# Patient Record
Sex: Female | Born: 1965 | Race: White | Hispanic: No | Smoking: Former smoker
Health system: Southern US, Community
[De-identification: ages and names within clinical notes are randomized; demographics above are authoritative.]

## PROBLEM LIST (undated history)

## (undated) DIAGNOSIS — Z9889 Other specified postprocedural states: Secondary | ICD-10-CM

## (undated) DIAGNOSIS — R112 Nausea with vomiting, unspecified: Secondary | ICD-10-CM

## (undated) DIAGNOSIS — I10 Essential (primary) hypertension: Secondary | ICD-10-CM

## (undated) DIAGNOSIS — K219 Gastro-esophageal reflux disease without esophagitis: Secondary | ICD-10-CM

## (undated) HISTORY — DX: Gastro-esophageal reflux disease without esophagitis: K21.9

## (undated) HISTORY — DX: Essential (primary) hypertension: I10

## (undated) HISTORY — PX: WISDOM TOOTH EXTRACTION: SHX21

---

## 1986-01-29 HISTORY — PX: BREAST SURGERY: SHX581

## 1986-01-29 HISTORY — PX: REDUCTION MAMMAPLASTY: SUR839

## 1999-04-17 ENCOUNTER — Other Ambulatory Visit: Admission: RE | Admit: 1999-04-17 | Discharge: 1999-04-17 | Payer: Self-pay | Admitting: Gynecology

## 2000-08-02 ENCOUNTER — Other Ambulatory Visit: Admission: RE | Admit: 2000-08-02 | Discharge: 2000-08-02 | Payer: Self-pay | Admitting: Gynecology

## 2002-01-16 ENCOUNTER — Other Ambulatory Visit: Admission: RE | Admit: 2002-01-16 | Discharge: 2002-01-16 | Payer: Self-pay | Admitting: Gynecology

## 2003-05-10 ENCOUNTER — Other Ambulatory Visit: Admission: RE | Admit: 2003-05-10 | Discharge: 2003-05-10 | Payer: Self-pay | Admitting: Gynecology

## 2003-05-12 ENCOUNTER — Ambulatory Visit (HOSPITAL_COMMUNITY): Admission: RE | Admit: 2003-05-12 | Discharge: 2003-05-12 | Payer: Self-pay | Admitting: Gynecology

## 2005-01-29 HISTORY — PX: DILATION AND CURETTAGE OF UTERUS: SHX78

## 2012-06-24 ENCOUNTER — Encounter: Payer: Self-pay | Admitting: Gastroenterology

## 2012-06-25 ENCOUNTER — Encounter (HOSPITAL_COMMUNITY): Payer: Self-pay | Admitting: Pharmacy Technician

## 2012-06-25 ENCOUNTER — Encounter: Payer: Self-pay | Admitting: Gastroenterology

## 2012-06-25 ENCOUNTER — Ambulatory Visit (INDEPENDENT_AMBULATORY_CARE_PROVIDER_SITE_OTHER): Payer: Managed Care, Other (non HMO) | Admitting: Gastroenterology

## 2012-06-25 VITALS — BP 139/92 | HR 69 | Temp 97.6°F | Ht 64.0 in | Wt 185.8 lb

## 2012-06-25 DIAGNOSIS — R1013 Epigastric pain: Secondary | ICD-10-CM | POA: Insufficient documentation

## 2012-06-25 DIAGNOSIS — R1310 Dysphagia, unspecified: Secondary | ICD-10-CM | POA: Insufficient documentation

## 2012-06-25 DIAGNOSIS — K219 Gastro-esophageal reflux disease without esophagitis: Secondary | ICD-10-CM

## 2012-06-25 DIAGNOSIS — R1314 Dysphagia, pharyngoesophageal phase: Secondary | ICD-10-CM

## 2012-06-25 NOTE — Progress Notes (Signed)
Primary Care Physician:  [facility], Heather Huwe, PA-C  Primary Gastroenterologist:  Michael Rourk, MD   Chief Complaint  Patient presents with  . Gastrophageal Reflux    HPI:  Jessica Roman is a 47 y.o. female here for recent dysphagia and GERD. Several weeks ago, she developed acute issues swallowing. Food sticking in lower esophagus and associated with retrosternal pain and radiation into the back. Eating helps epigastric burning. Swallowing issues lasted for about one week. Happens with food and fish oil capsules. Felt egg mcmuffin stuck for two hours. In the past two years weight up 25 pounds, incraesed stress. Still with burning but dysphagia better. PPI two weeks. Some softer stools. No melena, brbpr. Ibuprofen for knee pains, 400mg three times a week. No melena, brbpr.   Current Outpatient Prescriptions  Medication Sig Dispense Refill  . hydrochlorothiazide (MICROZIDE) 12.5 MG capsule Take 12.5 mg by mouth daily.      . Multiple Vitamin (MULTIVITAMIN) capsule Take 1 capsule by mouth daily.      . omeprazole (PRILOSEC OTC) 20 MG tablet Take 20 mg by mouth daily.       No current facility-administered medications for this visit.    Allergies as of 06/25/2012  . (No Known Allergies)    Past Medical History  Diagnosis Date  . GERD (gastroesophageal reflux disease)   . Hypertension     Past Surgical History  Procedure Laterality Date  . Wisdom tooth extraction    . Dilation and curettage of uterus  2007  . Breast surgery  1988    Reduction    Family History  Problem Relation Age of Onset  . Colon cancer Neg Hx   . Ulcers Neg Hx   . Heart attack Father   . Inflammatory bowel disease Neg Hx   . Irritable bowel syndrome Sister     History   Social History  . Marital Status: Unknown    Spouse Name: N/A    Number of Children: N/A  . Years of Education: N/A   Occupational History  . Not on file.   Social History Main Topics  . Smoking status:  Never Smoker   . Smokeless tobacco: Not on file  . Alcohol Use: No     Comment: twice a week  . Drug Use: No  . Sexually Active: Not on file   Other Topics Concern  . Not on file   Social History Narrative  . No narrative on file      ROS:  General: Negative for anorexia, weight loss, fever, chills, fatigue, weakness. Eyes: Negative for vision changes.  ENT: Negative for hoarseness, nasal congestion. CV: Negative for chest pain, angina, palpitations, dyspnea on exertion, peripheral edema.  Respiratory: Negative for dyspnea at rest, dyspnea on exertion, cough, sputum, wheezing.  GI: See history of present illness. GU:  Negative for dysuria, hematuria, urinary incontinence, urinary frequency, nocturnal urination.  MS: Negative for joint pain, low back pain.  Derm: Negative for rash or itching.  Neuro: Negative for weakness, abnormal sensation, seizure, frequent headaches, memory loss, confusion.  Psych: Negative for anxiety, depression, suicidal ideation, hallucinations.  Endo: Negative for unusual weight change.  Heme: Negative for bruising or bleeding. Allergy: Negative for rash or hives.    Physical Examination:  BP 139/92  Pulse 69  Temp(Src) 97.6 F (36.4 C) (Oral)  Ht 5' 4" (1.626 m)  Wt 185 lb 12.8 oz (84.278 kg)  BMI 31.88 kg/m2  LMP 05/30/2012   General: Well-nourished, well-developed   in no acute distress.  Head: Normocephalic, atraumatic.   Eyes: Conjunctiva pink, no icterus. Mouth: Oropharyngeal mucosa moist and pink , no lesions erythema or exudate. Neck: Supple without thyromegaly, masses, or lymphadenopathy.  Lungs: Clear to auscultation bilaterally.  Heart: Regular rate and rhythm, no murmurs rubs or gallops.  Abdomen: Bowel sounds are normal, nontender, nondistended, no hepatosplenomegaly or masses, no abdominal bruits or    hernia , no rebound or guarding.   Rectal: not performed Extremities: No lower extremity edema. No clubbing or deformities.   Neuro: Alert and oriented x 4 , grossly normal neurologically.  Skin: Warm and dry, no rash or jaundice.   Psych: Alert and cooperative, normal mood and affect.   Labs from 03/2012: HgbA1C: 5.8, WBC 7100, H/H 14.6/42.1, Plt 319000 

## 2012-06-25 NOTE — Assessment & Plan Note (Addendum)
47 y/o female with new onset GERD, epigastric burning, esophageal dysphagia. Some improvement on PPI. Recent regular NSAID use. DDx includes reflux esophagitis, pill induced esophagitis, PUD. Recommend EGD+/-ED in the near future.  I have discussed the risks, alternatives, benefits with regards to but not limited to the risk of reaction to medication, bleeding, infection, perforation and the patient is agreeable to proceed. Written consent to be obtained.  Symptoms likely worsened by progressive weight gain. Recommend slow gradual weight loss of at least 10 pounds.  Continue PPI.

## 2012-06-25 NOTE — Patient Instructions (Addendum)
1. We have scheduled you for an upper endoscopy. Please see separate instructions.

## 2012-06-26 NOTE — Progress Notes (Signed)
Cc PCP 

## 2012-06-30 ENCOUNTER — Encounter (HOSPITAL_COMMUNITY): Payer: Self-pay | Admitting: *Deleted

## 2012-06-30 ENCOUNTER — Encounter (HOSPITAL_COMMUNITY)
Admission: RE | Disposition: A | Discharge status: Home / Self Care | Payer: Self-pay | Source: Ambulatory Visit | Attending: Internal Medicine

## 2012-06-30 ENCOUNTER — Ambulatory Visit (HOSPITAL_COMMUNITY)
Admission: RE | Admit: 2012-06-30 | Discharge: 2012-06-30 | Disposition: A | Discharge status: Home / Self Care | Payer: Managed Care, Other (non HMO) | Source: Ambulatory Visit | Attending: Internal Medicine | Admitting: Internal Medicine

## 2012-06-30 DIAGNOSIS — R1013 Epigastric pain: Secondary | ICD-10-CM

## 2012-06-30 DIAGNOSIS — K219 Gastro-esophageal reflux disease without esophagitis: Secondary | ICD-10-CM

## 2012-06-30 DIAGNOSIS — R079 Chest pain, unspecified: Secondary | ICD-10-CM

## 2012-06-30 DIAGNOSIS — K449 Diaphragmatic hernia without obstruction or gangrene: Secondary | ICD-10-CM | POA: Insufficient documentation

## 2012-06-30 DIAGNOSIS — R1310 Dysphagia, unspecified: Secondary | ICD-10-CM | POA: Insufficient documentation

## 2012-06-30 DIAGNOSIS — I10 Essential (primary) hypertension: Secondary | ICD-10-CM | POA: Insufficient documentation

## 2012-06-30 DIAGNOSIS — Z79899 Other long term (current) drug therapy: Secondary | ICD-10-CM | POA: Insufficient documentation

## 2012-06-30 HISTORY — PX: ESOPHAGOGASTRODUODENOSCOPY (EGD) WITH ESOPHAGEAL DILATION: SHX5812

## 2012-06-30 SURGERY — ESOPHAGOGASTRODUODENOSCOPY (EGD) WITH ESOPHAGEAL DILATION
Anesthesia: Moderate Sedation

## 2012-06-30 MED ORDER — MIDAZOLAM HCL 5 MG/5ML IJ SOLN
INTRAMUSCULAR | Status: AC
  Filled 2012-06-30: qty 10

## 2012-06-30 MED ORDER — MEPERIDINE HCL 100 MG/ML IJ SOLN
INTRAMUSCULAR | Status: DC | PRN
  Administered 2012-06-30 (×2): 25 mg via INTRAVENOUS
  Administered 2012-06-30: 50 mg via INTRAVENOUS

## 2012-06-30 MED ORDER — ONDANSETRON HCL 4 MG/2ML IJ SOLN
INTRAMUSCULAR | Status: DC | PRN
  Administered 2012-06-30: 4 mg via INTRAVENOUS

## 2012-06-30 MED ORDER — SODIUM CHLORIDE 0.9 % IV SOLN
INTRAVENOUS | Status: DC
  Administered 2012-06-30: 07:00:00 via INTRAVENOUS

## 2012-06-30 MED ORDER — MIDAZOLAM HCL 5 MG/5ML IJ SOLN
INTRAMUSCULAR | Status: DC | PRN
  Administered 2012-06-30: 1 mg via INTRAVENOUS
  Administered 2012-06-30 (×2): 2 mg via INTRAVENOUS
  Administered 2012-06-30: 1 mg via INTRAVENOUS

## 2012-06-30 MED ORDER — STERILE WATER FOR IRRIGATION IR SOLN
Status: DC | PRN
  Administered 2012-06-30: 08:00:00

## 2012-06-30 MED ORDER — MEPERIDINE HCL 100 MG/ML IJ SOLN
INTRAMUSCULAR | Status: AC
  Filled 2012-06-30: qty 2

## 2012-06-30 MED ORDER — BUTAMBEN-TETRACAINE-BENZOCAINE 2-2-14 % EX AERO
INHALATION_SPRAY | CUTANEOUS | Status: DC | PRN
  Administered 2012-06-30: 2 via TOPICAL

## 2012-06-30 MED ORDER — ONDANSETRON HCL 4 MG/2ML IJ SOLN
INTRAMUSCULAR | Status: AC
  Filled 2012-06-30: qty 2

## 2012-06-30 NOTE — H&P (View-Only) (Signed)
Primary Care Physician:  [facility], Bartolo Darter, [place]  Primary Gastroenterologist:  Roetta Sessions, MD   Chief Complaint  Patient presents with  . Gastrophageal Reflux    HPI:  Jessica Roman is a 47 y.o. female here for recent dysphagia and GERD. Several weeks ago, she developed acute issues swallowing. Food sticking in lower esophagus and associated with retrosternal pain and radiation into the back. Eating helps epigastric burning. Swallowing issues lasted for about one week. Happens with food and fish oil capsules. Felt egg mcmuffin stuck for two hours. In the past two years weight up 25 pounds, incraesed stress. Still with burning but dysphagia better. PPI two weeks. Some softer stools. No melena, brbpr. Ibuprofen for knee pains, 400mg  three times a week. No melena, brbpr.   Current Outpatient Prescriptions  Medication Sig Dispense Refill  . hydrochlorothiazide (MICROZIDE) 12.5 MG capsule Take 12.5 mg by mouth daily.      . Multiple Vitamin (MULTIVITAMIN) capsule Take 1 capsule by mouth daily.      Marland Kitchen omeprazole (PRILOSEC OTC) 20 MG tablet Take 20 mg by mouth daily.       No current facility-administered medications for this visit.    Allergies as of 06/25/2012  . (No Known Allergies)    Past Medical History  Diagnosis Date  . GERD (gastroesophageal reflux disease)   . Hypertension     Past Surgical History  Procedure Laterality Date  . Wisdom tooth extraction    . Dilation and curettage of uterus  2007  . Breast surgery  1988    Reduction    Family History  Problem Relation Age of Onset  . Colon cancer Neg Hx   . Ulcers Neg Hx   . Heart attack Father   . Inflammatory bowel disease Neg Hx   . Irritable bowel syndrome Sister     History   Social History  . Marital Status: Unknown    Spouse Name: N/A    Number of Children: N/A  . Years of Education: N/A   Occupational History  . Not on file.   Social History Main Topics  . Smoking status:  Never Smoker   . Smokeless tobacco: Not on file  . Alcohol Use: No     Comment: twice a week  . Drug Use: No  . Sexually Active: Not on file   Other Topics Concern  . Not on file   Social History Narrative  . No narrative on file      ROS:  General: Negative for anorexia, weight loss, fever, chills, fatigue, weakness. Eyes: Negative for vision changes.  ENT: Negative for hoarseness, nasal congestion. CV: Negative for chest pain, angina, palpitations, dyspnea on exertion, peripheral edema.  Respiratory: Negative for dyspnea at rest, dyspnea on exertion, cough, sputum, wheezing.  GI: See history of present illness. GU:  Negative for dysuria, hematuria, urinary incontinence, urinary frequency, nocturnal urination.  MS: Negative for joint pain, low back pain.  Derm: Negative for rash or itching.  Neuro: Negative for weakness, abnormal sensation, seizure, frequent headaches, memory loss, confusion.  Psych: Negative for anxiety, depression, suicidal ideation, hallucinations.  Endo: Negative for unusual weight change.  Heme: Negative for bruising or bleeding. Allergy: Negative for rash or hives.    Physical Examination:  BP 139/92  Pulse 69  Temp(Src) 97.6 F (36.4 C) (Oral)  Ht 5\' 4"  (1.626 m)  Wt 185 lb 12.8 oz (84.278 kg)  BMI 31.88 kg/m2  LMP 05/30/2012   General: Well-nourished, well-developed  in no acute distress.  Head: Normocephalic, atraumatic.   Eyes: Conjunctiva pink, no icterus. Mouth: Oropharyngeal mucosa moist and pink , no lesions erythema or exudate. Neck: Supple without thyromegaly, masses, or lymphadenopathy.  Lungs: Clear to auscultation bilaterally.  Heart: Regular rate and rhythm, no murmurs rubs or gallops.  Abdomen: Bowel sounds are normal, nontender, nondistended, no hepatosplenomegaly or masses, no abdominal bruits or    hernia , no rebound or guarding.   Rectal: not performed Extremities: No lower extremity edema. No clubbing or deformities.   Neuro: Alert and oriented x 4 , grossly normal neurologically.  Skin: Warm and dry, no rash or jaundice.   Psych: Alert and cooperative, normal mood and affect.   Labs from 03/2012: HgbA1C: 5.8, WBC 7100, H/H 14.6/42.1, Plt 319000

## 2012-06-30 NOTE — Op Note (Signed)
[facility] [address] [place], [address]   ENDOSCOPY PROCEDURE REPORT  PATIENT: Jessica Roman, Jessica Roman  MR#: [id number] BIRTHDATE: 24-May-1965 , 47  yrs. old GENDER: Female ENDOSCOPIST: R.  Roetta Sessions, MD FACP [facility] Graham REFERRED BY:   [facility] PROCEDURE DATE:  06/30/2012 PROCEDURE:     EGD with Elease Hashimoto dilation and esophageal biopsy  INDICATIONS:     GERD/chest pain and esophageal dysphagia  INFORMED CONSENT:   The risks, benefits, limitations, alternatives and imponderables have been discussed.  The potential for biopsy, esophogeal dilation, etc. have also been reviewed.  Questions have been answered.  All parties agreeable.  Please see the history and physical in the medical record for more information.  MEDICATIONS:  Versed 6 mg IV and Demerol 100 mg IV in divided doses. Cetacaine spray Zofran 4 mg IV  DESCRIPTION OF PROCEDURE:   The XB-1478G ([id number])  endoscope was introduced through the mouth and advanced to the second portion of the duodenum without difficulty or limitations.  The mucosal surfaces were surveyed very carefully during advancement of the scope and upon withdrawal.  Retroflexion view of the proximal stomach and esophagogastric junction was performed.      FINDINGS:   Normal-appearing, patent appearing tubular esophagus. Normal appearing esophageal mucosa. Stomach empty. Small hiatal hernia. Normal gastric mucosa. Patent pylorus. Normal first and second portion of the duodenum  THERAPEUTIC / DIAGNOSTIC MANEUVERS PERFORMED: A 54 French Maloney dilator was passed to full insertion easily. A look back revealed no apparent complication related to the liver. Subsequently, biopsies of the distal esophageal taken to evaluate for eosinophilic esophagitis.   COMPLICATIONS:  None  IMPRESSION:  Small hiatal hernia; otherwise normal examination. Status post Maloney dilation and esophageal biopsy  RECOMMENDATIONS:  Followup on  pathology    _______________________________ R. Roetta Sessions, MD FACP [facility] eSigned:  R. Roetta Sessions, MD FACP [facility] 06/30/2012 8:23 AM     CC:

## 2012-06-30 NOTE — Interval H&P Note (Signed)
History and Physical Interval Note:  06/30/2012 7:42 AM  Jessica Roman  has presented today for surgery, with the diagnosis of Epgigastric Pain, GERD, Dysphagia  The various methods of treatment have been discussed with the patient and family. After consideration of risks, benefits and other options for treatment, the patient has consented to  Procedure(s) with comments: ESOPHAGOGASTRODUODENOSCOPY (EGD) WITH ESOPHAGEAL DILATION (N/A) - 1:30-moved to 730 Pt notified by Florian Buff as a surgical intervention .  The patient's history has been reviewed, patient examined, no change in status, stable for surgery.  I have reviewed the patient's chart and labs.  Questions were answered to the patient's satisfaction.     [name]  EGD with possible esophageal dilation/biopsy, etc. as appropriate.The risks, benefits, limitations, alternatives and imponderables have been reviewed with the patient. Questions have been answered. All parties are agreeable.

## 2012-07-01 ENCOUNTER — Encounter (HOSPITAL_COMMUNITY): Payer: Self-pay | Admitting: Internal Medicine

## 2012-07-13 ENCOUNTER — Encounter: Payer: Self-pay | Admitting: Internal Medicine

## 2012-07-15 NOTE — Progress Notes (Signed)
Cc PCP 

## 2012-07-15 NOTE — Progress Notes (Unsigned)
Letter from: Corbin Ade   Send letter to patient.  Send copy of letter with path to referring provider and PCP.  Would offer f/u appt w extender 4- 6 weeks re: chest pain/dysphagia

## 2012-07-16 ENCOUNTER — Encounter: Payer: Self-pay | Admitting: Internal Medicine

## 2012-07-16 NOTE — Progress Notes (Signed)
Pt is aware of OV on 7/16 at 0945 and appt card was mailed

## 2012-08-11 ENCOUNTER — Encounter: Payer: Self-pay | Admitting: Internal Medicine

## 2012-08-13 ENCOUNTER — Ambulatory Visit: Payer: Managed Care, Other (non HMO) | Admitting: Gastroenterology

## 2012-08-13 ENCOUNTER — Telehealth: Payer: Self-pay | Admitting: Gastroenterology

## 2012-08-13 NOTE — Telephone Encounter (Signed)
Patient was a no-show 

## 2012-12-04 ENCOUNTER — Other Ambulatory Visit: Payer: Self-pay

## 2013-11-13 ENCOUNTER — Other Ambulatory Visit: Payer: Self-pay

## 2014-12-16 ENCOUNTER — Ambulatory Visit (INDEPENDENT_AMBULATORY_CARE_PROVIDER_SITE_OTHER): Payer: PRIVATE HEALTH INSURANCE | Admitting: Nurse Practitioner

## 2014-12-16 ENCOUNTER — Encounter: Payer: Self-pay | Admitting: Nurse Practitioner

## 2014-12-16 VITALS — BP 150/89 | HR 73 | Temp 98.3°F | Ht 64.0 in | Wt 194.0 lb

## 2014-12-16 DIAGNOSIS — R1314 Dysphagia, pharyngoesophageal phase: Secondary | ICD-10-CM

## 2014-12-16 DIAGNOSIS — R1319 Other dysphagia: Secondary | ICD-10-CM

## 2014-12-16 DIAGNOSIS — R1310 Dysphagia, unspecified: Secondary | ICD-10-CM

## 2014-12-16 DIAGNOSIS — K219 Gastro-esophageal reflux disease without esophagitis: Secondary | ICD-10-CM

## 2014-12-16 MED ORDER — RANITIDINE HCL 150 MG PO TABS: 150.0000 mg | ORAL_TABLET | Freq: Two times a day (BID) | ORAL | Status: DC

## 2014-12-16 NOTE — Progress Notes (Signed)
Referring Provider: No ref. provider found Primary Care Physician:  PROVIDER NOT IN SYSTEM Primary GI: Dr. Gala Romney  Chief Complaint  Patient presents with  . Abdominal Pain    HPI:   49 year old female presents for GERD symptoms. Last EGD 06/30/2012 which found small hiatal hernia, otherwise normal exam. Status post Maloney dilation and esophageal biopsy. Pathology showed biopsy to be unremarkable squamous new Cozaar, no features of eosinophilic esophagitis, fungi, dysplasia, or malignancy.  Today she states she did quite well after her EGD. About a year ago, she began having increased symptoms. Currently has burning epigastric pain about once every 1-2 months. Has been under increased stress lately. Take Protonix once daily which she knows helps because when she rarely misses a dose she can notice a difference. Denies dysphagia symptoms but does have a feeling like "something is stuck in my throat." Will have reflux bitter/sour taste occasionally. Occasional nausea. Denies other abdominal pain, vomiting, hematochezia, melena. Has had some darkened stools when she drank pepto bismol. Denies chest pain, dyspnea, dizziness, lightheadedness, syncope, near syncope. Denies any other upper or lower GI symptoms.  Past Medical History  Diagnosis Date  . GERD (gastroesophageal reflux disease)   . Hypertension     Past Surgical History  Procedure Laterality Date  . Wisdom tooth extraction    . Dilation and curettage of uterus  2007  . Breast surgery  1988    Reduction  . Esophagogastroduodenoscopy (egd) with esophageal dilation N/A 06/30/2012    [id number] hiatal hernia; otherwise normal examination/Maloney dilation     Current Outpatient Prescriptions  Medication Sig Dispense Refill  . hydrochlorothiazide (MICROZIDE) 12.5 MG capsule Take 12.5 mg by mouth daily.    Marland Kitchen losartan (COZAAR) 25 MG tablet Take 25 mg by mouth daily.    . Multiple Vitamin (MULTIVITAMIN) capsule Take 1 capsule by mouth  daily.    . pantoprazole (PROTONIX) 40 MG tablet Take 40 mg by mouth daily.    Marland Kitchen omeprazole (PRILOSEC OTC) 20 MG tablet Take 20 mg by mouth daily.     No current facility-administered medications for this visit.    Allergies as of 12/16/2014  . (No Known Allergies)    Family History  Problem Relation Age of Onset  . Colon cancer Neg Hx   . Ulcers Neg Hx   . Heart attack Father   . Inflammatory bowel disease Neg Hx   . Irritable bowel syndrome Sister     Social History   Social History  . Marital Status: Divorced    Spouse Name: N/A  . Number of Children: N/A  . Years of Education: N/A   Social History Main Topics  . Smoking status: Never Smoker   . Smokeless tobacco: None  . Alcohol Use: Yes     Comment: twice a week  . Drug Use: No  . Sexual Activity: Not Asked   Other Topics Concern  . None   Social History Narrative    Review of Systems: General: Negative for anorexia, weight loss, fever, chills, fatigue, weakness. Eyes: Negative for vision changes.  ENT: Negative for hoarseness, difficulty swallowing. CV: Negative for chest pain, angina, palpitations,  peripheral edema.  Respiratory: Negative for dyspnea at rest, cough, sputum, wheezing.  GI: See history of present illness. Endo: Negative for unusual weight change.  Heme: Negative for bruising or bleeding.   Physical Exam: BP 150/89 mmHg  Pulse 73  Temp(Src) 98.3 F (36.8 C) (Oral)  Ht 5\' 4"  (1.626 m)  Wt 194  lb (87.998 kg)  BMI 33.28 kg/m2 General:   Alert and oriented. Pleasant and cooperative. Well-nourished and well-developed.  Head:  Normocephalic and atraumatic. Eyes:  Without icterus, sclera clear and conjunctiva pink.  Ears:  Normal auditory acuity. Cardiovascular:  S1, S2 present without murmurs appreciated. Normal pulses noted. Extremities without clubbing or edema. Respiratory:  Clear to auscultation bilaterally. No wheezes, rales, or rhonchi. No distress.  Gastrointestinal:  +BS,  soft, and non-distended. Minimal epigastric TTP. No HSM noted. No guarding or rebound. No masses appreciated.  Rectal:  Deferred  Neurologic:  Alert and oriented x4;  grossly normal neurologically. Psych:  Alert and cooperative. Normal mood and affect.    12/16/2014 8:20 AM

## 2014-12-16 NOTE — Assessment & Plan Note (Signed)
Last endoscopy due to GERD and dysphagia symptoms. She states she has a funny feeling in her throat, however she is not having symptoms that she previously did were food is getting stuck. This does not sound like dysphagia symptoms. If she does indeed need an endoscopy added to her for sever screening colonoscopy (as noted above) this will also further evaluate. If no endoscopy needed in the near future can also consider barium pill esophagram to further evaluate his symptoms persist. Return for follow-up in 6-8 weeks.

## 2014-12-16 NOTE — Assessment & Plan Note (Signed)
As noticed an increase in symptoms over the past year. Her symptoms seem to be pretty irregular and intermittent. She states she's been under a lot of stress lately. Her epigastric pain only occurs about once every 1-2 months. Does have some reflux of bitter material. Has noted that she has put on some weight lately as well and thinks this may be causing some of her symptoms. At this point I will add Zantac 150 mg every other day to take in the evening. We will have her return in 6-8 weeks for follow-up. At this time we'll discuss her need for first-ever screening colonoscopy as she turns 16 in January. If her symptoms are still not improve we can consider adding an endoscopy onto her colonoscopy.

## 2014-12-16 NOTE — Patient Instructions (Signed)
1. I send in prescription for Zantac to your pharmacy. Take 150 mg pill every other day in evening. 2. Return for follow-up in 6-8 weeks. We will discuss your first ever screening colonoscopy at that point. 3. Call us if you have any worsening symptoms before then.

## 2014-12-20 NOTE — Progress Notes (Signed)
No pcp per pt °

## 2015-02-15 ENCOUNTER — Other Ambulatory Visit: Payer: Self-pay

## 2015-02-15 ENCOUNTER — Encounter: Payer: Self-pay | Admitting: Nurse Practitioner

## 2015-02-15 ENCOUNTER — Ambulatory Visit (INDEPENDENT_AMBULATORY_CARE_PROVIDER_SITE_OTHER): Payer: PRIVATE HEALTH INSURANCE | Admitting: Nurse Practitioner

## 2015-02-15 VITALS — BP 148/97 | HR 74 | Temp 97.5°F | Ht 63.25 in | Wt 196.4 lb

## 2015-02-15 DIAGNOSIS — K219 Gastro-esophageal reflux disease without esophagitis: Secondary | ICD-10-CM | POA: Diagnosis not present

## 2015-02-15 DIAGNOSIS — R1310 Dysphagia, unspecified: Secondary | ICD-10-CM

## 2015-02-15 DIAGNOSIS — Z1211 Encounter for screening for malignant neoplasm of colon: Secondary | ICD-10-CM

## 2015-02-15 DIAGNOSIS — R1314 Dysphagia, pharyngoesophageal phase: Secondary | ICD-10-CM

## 2015-02-15 DIAGNOSIS — R1319 Other dysphagia: Secondary | ICD-10-CM

## 2015-02-15 MED ORDER — PEG 3350-KCL-NA BICARB-NACL 420 G PO SOLR: 4000.0000 mL | ORAL | Status: DC

## 2015-02-15 NOTE — Progress Notes (Signed)
Referring Provider: No ref. provider found Primary Care Physician:  Wynelle Fanny Primary GI:  Dr. Gala Romney  Chief Complaint  Patient presents with  . Gastroesophageal Reflux    HPI:   50 year old female presents for follow-up on GERD symptoms and dysphagia. She was last seen in our office 12/16/2014 for the same. At that time she states she did very well after endoscopy and 45 [place] Maloney dilation completed on 06/30/2012 with esophageal biopsy unremarkable. At that time her GERD symptoms had well controlled with breakthrough about every 1-2 months, other more recently she was having an increase in symptoms associated with recent significant stress. At last visit we added Zantac 150 mg every other day in evening. Her dysphagia symptoms did not seem consistent with her previous symptoms and likely more nasal drainage. Decided to have her back in 6 weeks for follow-up and consider barium pill esophagram as well as to schedule her first-ever screening colonoscopy at age 34.  Today she states she is taking Zantac about every other evening. GERD symptoms improved, only 1 episode of dyspepsia since last visit (2 months ago). Stress is starting to improved as well. No further dysphagia symptoms. Denies hematochezia, melena. Occasional abdominal pain lower abdomen described as crampy and dull. Has a bowel movement daily, consistent with [place] 4, no straining. Denies fever, chills, N/V, unintentional weight loss. Denies chest pain, dyspnea, dizziness, lightheadedness, syncope, near syncope. Denies any other upper or lower GI symptoms.   Past Medical History  Diagnosis Date  . GERD (gastroesophageal reflux disease)   . Hypertension     Past Surgical History  Procedure Laterality Date  . Wisdom tooth extraction    . Dilation and curettage of uterus  2007  . Breast surgery  1988    Reduction  . Esophagogastroduodenoscopy (egd) with esophageal dilation N/A 06/30/2012    [id number] hiatal  hernia; otherwise normal examination/Maloney dilation     Current Outpatient Prescriptions  Medication Sig Dispense Refill  . hydrochlorothiazide (MICROZIDE) 12.5 MG capsule Take 12.5 mg by mouth daily.    Marland Kitchen losartan (COZAAR) 25 MG tablet Take 25 mg by mouth daily.    . Multiple Vitamin (MULTIVITAMIN) capsule Take 1 capsule by mouth daily.    . pantoprazole (PROTONIX) 40 MG tablet Take 40 mg by mouth daily.    . ranitidine (ZANTAC) 150 MG tablet Take 1 tablet (150 mg total) by mouth 2 (two) times daily. 30 tablet 2   No current facility-administered medications for this visit.    Allergies as of 02/15/2015  . (No Known Allergies)    Family History  Problem Relation Age of Onset  . Colon cancer Neg Hx   . Ulcers Neg Hx   . Heart attack Father   . Inflammatory bowel disease Neg Hx   . Irritable bowel syndrome Sister     Social History   Social History  . Marital Status: Divorced    Spouse Name: N/A  . Number of Children: N/A  . Years of Education: N/A   Social History Main Topics  . Smoking status: Never Smoker   . Smokeless tobacco: Never Used  . Alcohol Use: 0.0 oz/week    0 Standard drinks or equivalent per week     Comment: 4 times a week  . Drug Use: No  . Sexual Activity: Not Asked   Other Topics Concern  . None   Social History Narrative    Review of Systems: General: Negative for anorexia, weight loss, fever, chills, fatigue,  weakness. Eyes: Negative for vision changes.  ENT: Negative for hoarseness, difficulty swallowing. CV: Negative for chest pain, angina, palpitations, peripheral edema.  Respiratory: Negative for dyspnea at rest, cough, sputum, wheezing.  GI: See history of present illness. Endo: Negative for unusual weight change.  Heme: Negative for bruising or bleeding.   Physical Exam: BP 148/97 mmHg  Pulse 74  Temp(Src) 97.5 F (36.4 C) (Oral)  Ht 5' 3.25" (1.607 m)  Wt 196 lb 6.4 oz (89.086 kg)  BMI 34.50 kg/m2 General:   Alert and  oriented. Pleasant and cooperative. Well-nourished and well-developed.  Head:  Normocephalic and atraumatic. Eyes:  Without icterus, sclera clear and conjunctiva pink.  Ears:  Normal auditory acuity. Cardiovascular:  S1, S2 present without murmurs appreciated.  Extremities without clubbing or edema. Respiratory:  Clear to auscultation bilaterally. No wheezes, rales, or rhonchi. No distress.  Gastrointestinal:  +BS, soft, non-tender and non-distended. No HSM noted. No guarding or rebound. No masses appreciated.  Rectal:  Deferred  Neurologic:  Alert and oriented x4;  grossly normal neurologically. Psych:  Alert and cooperative. Normal mood and affect. Heme/Lymph/Immune: No excessive bruising noted.    02/15/2015 9:34 AM

## 2015-02-15 NOTE — Assessment & Plan Note (Signed)
GERD symptoms improved with the addition of Zantac 150 mg every other evening. Also with some improvement in the stress in her life as could be helping to alleviate her symptoms as well. Continue to monitor symptoms, return for follow-up in 6 months.

## 2015-02-15 NOTE — Assessment & Plan Note (Signed)
Dysphagia symptoms improved. Continue to monitor. Return for follow-up in 6 months.

## 2015-02-15 NOTE — Assessment & Plan Note (Signed)
Patient has turned 50 years old as of her last visit and is now due for initial screening colonoscopy. No history of colon cancer in her family, no other high risk indications. At this point she is generally asymptomatic from a GI standpoint with improvement in her GERD symptoms as noted above. At this point we'll proceed with first initial screening colonoscopy.  Proceed with TCS with Dr. Gala Romney in near future: the risks, benefits, and alternatives have been discussed with the patient in detail. The patient states understanding and desires to proceed.  Patient is not on any anticoagulants, anxiolytics, chronic pain medications, or antidepressants. Conscious sedation should be adequate for her procedure.

## 2015-02-15 NOTE — Patient Instructions (Signed)
1. Continue taking Protonix and Zantac as you have been taking. 2. We will schedule your procedure for you. 3. Further recommendations to be based on the results of your procedure. 4. Return for follow-up in 6 months to reassess all your symptoms and make any changes necessary.

## 2015-02-22 NOTE — Progress Notes (Signed)
CC'ED TO PCP 

## 2015-03-08 ENCOUNTER — Other Ambulatory Visit: Payer: Self-pay

## 2015-03-10 ENCOUNTER — Ambulatory Visit (HOSPITAL_COMMUNITY)
Admission: RE | Admit: 2015-03-10 | Discharge: 2015-03-10 | Disposition: A | Discharge status: Home / Self Care | Payer: PRIVATE HEALTH INSURANCE | Source: Ambulatory Visit | Attending: Internal Medicine | Admitting: Internal Medicine

## 2015-03-10 ENCOUNTER — Encounter (HOSPITAL_COMMUNITY)
Admission: RE | Disposition: A | Discharge status: Home / Self Care | Payer: Self-pay | Source: Ambulatory Visit | Attending: Internal Medicine

## 2015-03-10 ENCOUNTER — Encounter (HOSPITAL_COMMUNITY): Payer: Self-pay

## 2015-03-10 DIAGNOSIS — Z1211 Encounter for screening for malignant neoplasm of colon: Secondary | ICD-10-CM

## 2015-03-10 DIAGNOSIS — I10 Essential (primary) hypertension: Secondary | ICD-10-CM | POA: Insufficient documentation

## 2015-03-10 DIAGNOSIS — Z79899 Other long term (current) drug therapy: Secondary | ICD-10-CM | POA: Diagnosis not present

## 2015-03-10 DIAGNOSIS — K219 Gastro-esophageal reflux disease without esophagitis: Secondary | ICD-10-CM | POA: Diagnosis not present

## 2015-03-10 DIAGNOSIS — Z860100 Personal history of colon polyps, unspecified: Secondary | ICD-10-CM | POA: Insufficient documentation

## 2015-03-10 DIAGNOSIS — K5730 Diverticulosis of large intestine without perforation or abscess without bleeding: Secondary | ICD-10-CM | POA: Insufficient documentation

## 2015-03-10 DIAGNOSIS — D120 Benign neoplasm of cecum: Secondary | ICD-10-CM | POA: Diagnosis not present

## 2015-03-10 HISTORY — PX: COLONOSCOPY: SHX5424

## 2015-03-10 HISTORY — DX: Nausea with vomiting, unspecified: Z98.890

## 2015-03-10 HISTORY — DX: Nausea with vomiting, unspecified: R11.2

## 2015-03-10 SURGERY — COLONOSCOPY
Anesthesia: Moderate Sedation

## 2015-03-10 MED ORDER — SODIUM CHLORIDE 0.9 % IV SOLN
INTRAVENOUS | Status: DC
  Administered 2015-03-10: 11:00:00 via INTRAVENOUS

## 2015-03-10 MED ORDER — MEPERIDINE HCL 100 MG/ML IJ SOLN
INTRAMUSCULAR | Status: AC
  Filled 2015-03-10: qty 2

## 2015-03-10 MED ORDER — MIDAZOLAM HCL 5 MG/5ML IJ SOLN
INTRAMUSCULAR | Status: DC | PRN
  Administered 2015-03-10 (×2): 2 mg via INTRAVENOUS
  Administered 2015-03-10: 1 mg via INTRAVENOUS

## 2015-03-10 MED ORDER — ONDANSETRON HCL 4 MG/2ML IJ SOLN
INTRAMUSCULAR | Status: AC
  Filled 2015-03-10: qty 2

## 2015-03-10 MED ORDER — ONDANSETRON HCL 4 MG/2ML IJ SOLN
INTRAMUSCULAR | Status: DC | PRN
  Administered 2015-03-10: 4 mg via INTRAVENOUS

## 2015-03-10 MED ORDER — MIDAZOLAM HCL 5 MG/5ML IJ SOLN
INTRAMUSCULAR | Status: AC
  Filled 2015-03-10: qty 10

## 2015-03-10 MED ORDER — MEPERIDINE HCL 100 MG/ML IJ SOLN
INTRAMUSCULAR | Status: DC | PRN
  Administered 2015-03-10 (×2): 50 mg via INTRAVENOUS

## 2015-03-10 NOTE — Interval H&P Note (Signed)
History and Physical Interval Note:  03/10/2015 11:44 AM  Jessica Roman  has presented today for surgery, with the diagnosis of SCREENING  The various methods of treatment have been discussed with the patient and family. After consideration of risks, benefits and other options for treatment, the patient has consented to  Procedure(s) with comments: COLONOSCOPY (N/A) - 1000 - moved to 11:15 - office to notify as a surgical intervention .  The patient's history has been reviewed, patient examined, no change in status, stable for surgery.  I have reviewed the patient's chart and labs.  Questions were answered to the patient's satisfaction.     Ryland Tungate  No change. First ever screening colonoscopy per plan.  The risks, benefits, limitations, alternatives and imponderables have been reviewed with the patient. Questions have been answered. All parties are agreeable.

## 2015-03-10 NOTE — Op Note (Signed)
[facility] [address] [place], [address]   COLONOSCOPY PROCEDURE REPORT  PATIENT: Jessica Roman, Jessica Roman  MR#: [id number] BIRTHDATE: 06-Feb-1965 , 50  yrs. old GENDER: female ENDOSCOPIST: R.  Garfield Cornea, MD FACP [facility] REFERRED BY: Carlos Levering, FNP PROCEDURE DATE:  04/03/2015 PROCEDURE:   Colonoscopy with snare polypectomy INDICATIONS:   First ever average risk colorectal cancer screening examination. MEDICATIONS: Versed 5 mg IV and Demerol 100 mg IV in divided doses. Zofran 4 mg IV. ASA CLASS:       Class II  CONSENT: The risks, benefits, alternatives and imponderables including but not limited to bleeding, perforation as well as the possibility of a missed lesion have been reviewed.  The potential for biopsy, lesion removal, etc. have also been discussed. Questions have been answered.  All parties agreeable.  Please see the history and physical in the medical record for more information.  DESCRIPTION OF PROCEDURE:   After the risks benefits and alternatives of the procedure were thoroughly explained, informed consent was obtained.  The digital rectal exam revealed no abnormalities of the rectum.   The [id number]  endoscope was introduced through the anus and advanced to the cecum, which was identified by both the appendix and ileocecal valve. No adverse events experienced.   The quality of the prep was     The instrument was then slowly withdrawn as the colon was fully examined. Estimated blood loss is zero unless otherwise noted in this procedure report.      COLON FINDINGS: Internal hemorrhoids and anal papilla present; otherwise, normal-appearing rectal mucosa.  Scattered sigmoid diverticula; (1) 3 mm polyp in the base of the cecum at the appendiceal orifice; otherwise, remainder of colonic mucosa appeared normal.  The above-mentioned polyps was cold snare removed.  Retroflexion was performed. .  Withdrawal time=8 minutes 0 seconds.   The scope was withdrawn and the procedure completed. COMPLICATIONS: There were no immediate complications.  ENDOSCOPIC IMPRESSION: Single colonic polyp?"removed as described above. Colonic diverticulosis  RECOMMENDATIONS: Follow-up on pathology. Further recommendations to follow.  eSigned:  R. Garfield Cornea, MD Rosalita Chessman [facility] Apr 03, 2015 12:16 PM   cc:  CPT CODES: ICD CODES:  The ICD and CPT codes recommended by this software are interpretations from the data that the clinical staff has captured with the software.  The verification of the translation of this report to the ICD and CPT codes and modifiers is the sole responsibility of the health care institution and practicing physician where this report was generated.  [employer] will not be held responsible for the validity of the ICD and CPT codes included on this report.  AMA assumes no liability for data contained or not contained herein. CPT is a Designer, television/film set of the [employer].

## 2015-03-10 NOTE — Discharge Instructions (Signed)
°Colonoscopy °Discharge Instructions ° °Read the instructions outlined below and refer to this sheet in the next few weeks. These discharge instructions provide you with general information on caring for yourself after you leave the hospital. Your doctor may also give you specific instructions. While your treatment has been planned according to the most current medical practices available, unavoidable complications occasionally occur. If you have any problems or questions after discharge, call Dr. Rourk at [phone]. °ACTIVITY °· You may resume your regular activity, but move at a slower pace for the next 24 hours.  °· Take frequent rest periods for the next 24 hours.  °· Walking will help get rid of the air and reduce the bloated feeling in your belly (abdomen).  °· No driving for 24 hours (because of the medicine (anesthesia) used during the test).   °· Do not sign any important legal documents or operate any machinery for 24 hours (because of the anesthesia used during the test).  °NUTRITION °· Drink plenty of fluids.  °· You may resume your normal diet as instructed by your doctor.  °· Begin with a light meal and progress to your normal diet. Heavy or fried foods are harder to digest and may make you feel sick to your stomach (nauseated).  °· Avoid alcoholic beverages for 24 hours or as instructed.  °MEDICATIONS °· You may resume your normal medications unless your doctor tells you otherwise.  °WHAT YOU CAN EXPECT TODAY °· Some feelings of bloating in the abdomen.  °· Passage of more gas than usual.  °· Spotting of blood in your stool or on the toilet paper.  °IF YOU HAD POLYPS REMOVED DURING THE COLONOSCOPY: °· No aspirin products for 7 days or as instructed.  °· No alcohol for 7 days or as instructed.  °· Eat a soft diet for the next 24 hours.  °FINDING OUT THE RESULTS OF YOUR TEST °Not all test results are available during your visit. If your test results are not back during the visit, make an appointment  with your caregiver to find out the results. Do not assume everything is normal if you have not heard from your caregiver or the medical facility. It is important for you to follow up on all of your test results.  °SEEK IMMEDIATE MEDICAL ATTENTION IF: °· You have more than a spotting of blood in your stool.  °· Your belly is swollen (abdominal distention).  °· You are nauseated or vomiting.  °· You have a temperature over 101.  °· You have abdominal pain or discomfort that is severe or gets worse throughout the day.  ° ° °Colon polyp and diverticulosis information provided ° °Further recommendations to follow pending review of pathology report ° ° °Diverticulosis °Diverticulosis is the condition that develops when small pouches (diverticula) form in the wall of your colon. Your colon, or large intestine, is where water is absorbed and stool is formed. The pouches form when the inside layer of your colon pushes through weak spots in the outer layers of your colon. °CAUSES  °No one knows exactly what causes diverticulosis. °RISK FACTORS °· Being older than 50. Your risk for this condition increases with age. Diverticulosis is rare in people younger than 40 years. By age 80, almost everyone has it. °· Eating a low-fiber diet. °· Being frequently constipated. °· Being overweight. °· Not getting enough exercise. °· Smoking. °· Taking over-the-counter pain medicines, like aspirin and ibuprofen. °SYMPTOMS  °Most people with diverticulosis do not have symptoms. °DIAGNOSIS  °  Because diverticulosis often has no symptoms, health care providers often discover the condition during an exam for other colon problems. In many cases, a health care provider will diagnose diverticulosis while using a flexible scope to examine the colon (colonoscopy). TREATMENT  If you have never developed an infection related to diverticulosis, you may not need treatment. If you have had an infection before, treatment may include:  Eating more  fruits, vegetables, and grains.  Taking a fiber supplement.  Taking a live bacteria supplement (probiotic).  Taking medicine to relax your colon. HOME CARE INSTRUCTIONS   Drink at least 6-8 glasses of water each day to prevent constipation.  Try not to strain when you have a bowel movement.  Keep all follow-up appointments. If you have had an infection before:  Increase the fiber in your diet as directed by your health care provider or dietitian.  Take a dietary fiber supplement if your health care provider approves.  Only take medicines as directed by your health care provider. SEEK MEDICAL CARE IF:   You have abdominal pain.  You have bloating.  You have cramps.  You have not gone to the bathroom in 3 days. SEEK IMMEDIATE MEDICAL CARE IF:   Your pain gets worse.  Yourbloating becomes very bad.  You have a fever or chills, and your symptoms suddenly get worse.  You begin vomiting.  You have bowel movements that are bloody or black. MAKE SURE YOU:  Understand these instructions.  Will watch your condition.  Will get help right away if you are not doing well or get worse.   This information is not intended to replace advice given to you by your health care provider. Make sure you discuss any questions you have with your health care provider.   Document Released: 10/13/2003 Document Revised: 01/20/2013 Document Reviewed: 12/10/2012 Elsevier Interactive Patient Education 2016 Elsevier Inc. Colon Polyps Polyps are lumps of extra tissue growing inside the body. Polyps can grow in the large intestine (colon). Most colon polyps are noncancerous (benign). However, some colon polyps can become cancerous over time. Polyps that are larger than a pea may be harmful. To be safe, caregivers remove and test all polyps. CAUSES  Polyps form when mutations in the genes cause your cells to grow and divide even though no more tissue is needed. RISK FACTORS There are a number  of risk factors that can increase your chances of getting colon polyps. They include:  Being older than 50 years.  Family history of colon polyps or colon cancer.  Long-term colon diseases, such as colitis or Crohn disease.  Being overweight.  Smoking.  Being inactive.  Drinking too much alcohol. SYMPTOMS  Most small polyps do not cause symptoms. If symptoms are present, they may include:  Blood in the stool. The stool may look dark red or black.  Constipation or diarrhea that lasts longer than 1 week. DIAGNOSIS People often do not know they have polyps until their caregiver finds them during a regular checkup. Your caregiver can use 4 tests to check for polyps:  Digital rectal exam. The caregiver wears gloves and feels inside the rectum. This test would find polyps only in the rectum.  Barium enema. The caregiver puts a liquid called barium into your rectum before taking X-rays of your colon. Barium makes your colon look white. Polyps are dark, so they are easy to see in the X-ray pictures.  Sigmoidoscopy. A thin, flexible tube (sigmoidoscope) is placed into your rectum. The sigmoidoscope has a  light and tiny camera in it. The caregiver uses the sigmoidoscope to look at the last third of your colon.  Colonoscopy. This test is like sigmoidoscopy, but the caregiver looks at the entire colon. This is the most common method for finding and removing polyps. TREATMENT  Any polyps will be removed during a sigmoidoscopy or colonoscopy. The polyps are then tested for cancer. PREVENTION  To help lower your risk of getting more colon polyps:  Eat plenty of fruits and vegetables. Avoid eating fatty foods.  Do not smoke.  Avoid drinking alcohol.  Exercise every day.  Lose weight if recommended by your caregiver.  Eat plenty of calcium and folate. Foods that are rich in calcium include milk, cheese, and broccoli. Foods that are rich in folate include chickpeas, kidney beans, and  spinach. HOME CARE INSTRUCTIONS Keep all follow-up appointments as directed by your caregiver. You may need periodic exams to check for polyps. SEEK MEDICAL CARE IF: You notice bleeding during a bowel movement.   This information is not intended to replace advice given to you by your health care provider. Make sure you discuss any questions you have with your health care provider.   Document Released: 10/12/2003 Document Revised: 02/05/2014 Document Reviewed: 03/27/2011 Elsevier Interactive Patient Education [employer].

## 2015-03-10 NOTE — H&P (View-Only) (Signed)
Referring Provider: No ref. provider found Primary Care Physician:  Wynelle Fanny Primary GI:  Dr. Gala Romney  Chief Complaint  Patient presents with  . Gastroesophageal Reflux    HPI:   50 year old female presents for follow-up on GERD symptoms and dysphagia. She was last seen in our office 12/16/2014 for the same. At that time she states she did very well after endoscopy and [address] [place] Maloney dilation completed on 06/30/2012 with esophageal biopsy unremarkable. At that time her GERD symptoms had well controlled with breakthrough about every 1-2 months, other more recently she was having an increase in symptoms associated with recent significant stress. At last visit we added Zantac 150 mg every other day in evening. Her dysphagia symptoms did not seem consistent with her previous symptoms and likely more nasal drainage. Decided to have her back in 6 weeks for follow-up and consider barium pill esophagram as well as to schedule her first-ever screening colonoscopy at age 78.  Today she states she is taking Zantac about every other evening. GERD symptoms improved, only 1 episode of dyspepsia since last visit (2 months ago). Stress is starting to improved as well. No further dysphagia symptoms. Denies hematochezia, melena. Occasional abdominal pain lower abdomen described as crampy and dull. Has a bowel movement daily, consistent with [place] 4, no straining. Denies fever, chills, N/V, unintentional weight loss. Denies chest pain, dyspnea, dizziness, lightheadedness, syncope, near syncope. Denies any other upper or lower GI symptoms.   Past Medical History  Diagnosis Date  . GERD (gastroesophageal reflux disease)   . Hypertension     Past Surgical History  Procedure Laterality Date  . Wisdom tooth extraction    . Dilation and curettage of uterus  2007  . Breast surgery  1988    Reduction  . Esophagogastroduodenoscopy (egd) with esophageal dilation N/A 06/30/2012    [id number] hiatal  hernia; otherwise normal examination/Maloney dilation     Current Outpatient Prescriptions  Medication Sig Dispense Refill  . hydrochlorothiazide (MICROZIDE) 12.5 MG capsule Take 12.5 mg by mouth daily.    Marland Kitchen losartan (COZAAR) 25 MG tablet Take 25 mg by mouth daily.    . Multiple Vitamin (MULTIVITAMIN) capsule Take 1 capsule by mouth daily.    . pantoprazole (PROTONIX) 40 MG tablet Take 40 mg by mouth daily.    . ranitidine (ZANTAC) 150 MG tablet Take 1 tablet (150 mg total) by mouth 2 (two) times daily. 30 tablet 2   No current facility-administered medications for this visit.    Allergies as of 02/15/2015  . (No Known Allergies)    Family History  Problem Relation Age of Onset  . Colon cancer Neg Hx   . Ulcers Neg Hx   . Heart attack Father   . Inflammatory bowel disease Neg Hx   . Irritable bowel syndrome Sister     Social History   Social History  . Marital Status: Divorced    Spouse Name: N/A  . Number of Children: N/A  . Years of Education: N/A   Social History Main Topics  . Smoking status: Never Smoker   . Smokeless tobacco: Never Used  . Alcohol Use: 0.0 oz/week    0 Standard drinks or equivalent per week     Comment: 4 times a week  . Drug Use: No  . Sexual Activity: Not Asked   Other Topics Concern  . None   Social History Narrative    Review of Systems: General: Negative for anorexia, weight loss, fever, chills, fatigue,  weakness. Eyes: Negative for vision changes.  ENT: Negative for hoarseness, difficulty swallowing. CV: Negative for chest pain, angina, palpitations, peripheral edema.  Respiratory: Negative for dyspnea at rest, cough, sputum, wheezing.  GI: See history of present illness. Endo: Negative for unusual weight change.  Heme: Negative for bruising or bleeding.   Physical Exam: BP 148/97 mmHg  Pulse 74  Temp(Src) 97.5 F (36.4 C) (Oral)  Ht 5' 3.25" (1.607 m)  Wt 196 lb 6.4 oz (89.086 kg)  BMI 34.50 kg/m2 General:   Alert and  oriented. Pleasant and cooperative. Well-nourished and well-developed.  Head:  Normocephalic and atraumatic. Eyes:  Without icterus, sclera clear and conjunctiva pink.  Ears:  Normal auditory acuity. Cardiovascular:  S1, S2 present without murmurs appreciated.  Extremities without clubbing or edema. Respiratory:  Clear to auscultation bilaterally. No wheezes, rales, or rhonchi. No distress.  Gastrointestinal:  +BS, soft, non-tender and non-distended. No HSM noted. No guarding or rebound. No masses appreciated.  Rectal:  Deferred  Neurologic:  Alert and oriented x4;  grossly normal neurologically. Psych:  Alert and cooperative. Normal mood and affect. Heme/Lymph/Immune: No excessive bruising noted.    02/15/2015 9:34 AM

## 2015-03-16 ENCOUNTER — Encounter (HOSPITAL_COMMUNITY): Payer: Self-pay | Admitting: Internal Medicine

## 2015-03-16 ENCOUNTER — Encounter: Payer: Self-pay | Admitting: Internal Medicine

## 2015-07-06 ENCOUNTER — Encounter: Payer: Self-pay | Admitting: Internal Medicine

## 2015-08-17 ENCOUNTER — Encounter: Payer: Self-pay | Admitting: Nurse Practitioner

## 2015-08-17 ENCOUNTER — Ambulatory Visit (INDEPENDENT_AMBULATORY_CARE_PROVIDER_SITE_OTHER): Payer: PRIVATE HEALTH INSURANCE | Admitting: Nurse Practitioner

## 2015-08-17 VITALS — BP 124/85 | HR 85 | Temp 98.0°F | Ht 63.0 in | Wt 171.2 lb

## 2015-08-17 DIAGNOSIS — K219 Gastro-esophageal reflux disease without esophagitis: Secondary | ICD-10-CM

## 2015-08-17 DIAGNOSIS — Z86010 Personal history of colonic polyps: Secondary | ICD-10-CM

## 2015-08-17 DIAGNOSIS — R1314 Dysphagia, pharyngoesophageal phase: Secondary | ICD-10-CM

## 2015-08-17 DIAGNOSIS — R1310 Dysphagia, unspecified: Secondary | ICD-10-CM

## 2015-08-17 DIAGNOSIS — R1319 Other dysphagia: Secondary | ICD-10-CM

## 2015-08-17 NOTE — Progress Notes (Signed)
cc'ed to pcp °

## 2015-08-17 NOTE — Patient Instructions (Signed)
1. Continue taking your acid blocker daily. 2. Can graduation's on the lifestyle changes with significant weight loss. 3. Continue dietary modifications which are your best chance for long-term symptom resolution.  4. Return for follow-up as needed for any recurrent symptoms.

## 2015-08-17 NOTE — Assessment & Plan Note (Signed)
Symptoms resolved. Has not had to take Zantac and some time. Continues on daily PPI. She is made significant lifestyle changes including 25 pound weight loss in the past 6 months by seeing a nutritionist for dietary guidance. She feels is been most effective for her symptoms. Symptoms much improved over the long-term. At this point have her return back as needed for any significant changes.

## 2015-08-17 NOTE — Assessment & Plan Note (Signed)
Resolved over the long-term, likely due to improve GERD controlled with lifestyle changes as noted above. Return for follow-up as needed for any significant symptoms.

## 2015-08-17 NOTE — Progress Notes (Signed)
Referring Provider: Carlos Levering, PA-C Primary Care Physician:  Wynelle Fanny Primary GI:  Dr. Gala Romney  Chief Complaint  Patient presents with  . Follow-up    doing well    HPI:   Jessica Roman is a 50 y.o. female who presents for 6 month follow-up. She was last seen in our office on 02/15/2015 for GERD, dysphagia, encounter for screening colonoscopy. At that time she states she has taken Zantac about every other evening with improved GERD symptoms only 1 episode in 2 months, resolved dysphagia. No other GI symptoms. Recommend she return in 6 months to follow-up on her symptoms. She is also scheduled for first-ever screening colonoscopy. Colonoscopy performed 03/10/15 which found single 3 mm polyp at the cecal base/appendiceal orifice, colonic diverticulosis. Surgical pathology found the polyp to be benign lymphoid polyp. Recommended repeat colonoscopy in 10 years.  Today she states she's doing well. Denies abdominal pain, N/V, hematochezia, melena, fever, chills. No GERD symptoms. No dysphagia. Has not had to take prn Zantac in "quite some time" with only a few episodes of breakthrough symptoms in the past 6 months. Has made significant lifestyle changes. Has lost about 25 pounds in the past 6 months. Denies chest pain, dyspnea, dizziness, lightheadedness, syncope, near syncope. Denies any other upper or lower GI symptoms.  Past Medical History  Diagnosis Date  . GERD (gastroesophageal reflux disease)   . Hypertension   . PONV (postoperative nausea and vomiting)     Past Surgical History  Procedure Laterality Date  . Wisdom tooth extraction    . Dilation and curettage of uterus  2007  . Breast surgery  1988    Reduction  . Esophagogastroduodenoscopy (egd) with esophageal dilation N/A 06/30/2012    [id number] hiatal hernia; otherwise normal examination/Maloney dilation   . Colonoscopy N/A 03/10/2015    Procedure: COLONOSCOPY;  Surgeon: Daneil Dolin, MD;  Location: [facility]  ENDO SU[facility];  Service: Endoscopy;  Laterality: N/A;  1000 - moved to 11:15 - office to notify    Current Outpatient Prescriptions  Medication Sig Dispense Refill  . hydrochlorothiazide (MICROZIDE) 12.5 MG capsule Take 12.5 mg by mouth daily.    Marland Kitchen losartan (COZAAR) 25 MG tablet Take 25 mg by mouth daily.    . Multiple Vitamin (MULTIVITAMIN) capsule Take 1 capsule by mouth daily.    . pantoprazole (PROTONIX) 40 MG tablet Take 40 mg by mouth daily.    . phentermine 37.5 MG capsule Take 37.5 mg by mouth every morning.    . polyethylene glycol-electrolytes (TRILYTE) 420 g solution Take 4,000 mLs by mouth as directed. 4000 mL 0  . ranitidine (ZANTAC) 150 MG tablet Take 1 tablet (150 mg total) by mouth 2 (two) times daily. (Patient not taking: Reported on 08/17/2015) 30 tablet 2   No current facility-administered medications for this visit.    Allergies as of 08/17/2015  . (No Known Allergies)    Family History  Problem Relation Age of Onset  . Colon cancer Neg Hx   . Ulcers Neg Hx   . Heart attack Father   . Inflammatory bowel disease Neg Hx   . Irritable bowel syndrome Sister     Social History   Social History  . Marital Status: Divorced    Spouse Name: N/A  . Number of Children: N/A  . Years of Education: N/A   Social History Main Topics  . Smoking status: Former Smoker    Quit date: 02/15/1995  . Smokeless tobacco: Never Used  .  Alcohol Use: 0.0 oz/week    0 Standard drinks or equivalent per week     Comment: 2 glasses of wine a week.  . Drug Use: No  . Sexual Activity: Not Asked   Other Topics Concern  . None   Social History Narrative    Review of Systems: General: Negative for anorexia, weight loss, fever, chills, fatigue, weakness. ENT: Negative for hoarseness, difficulty swallowing. CV: Negative for chest pain, angina, palpitations, peripheral edema.  Respiratory: Negative for dyspnea at rest, cough, sputum, wheezing.  GI: See history of present  illness. Endo: Negative for unusual weight change.    Physical Exam: BP 124/85 mmHg  Pulse 85  Temp(Src) 98 F (36.7 C) (Oral)  Ht 5\' 3"  (1.6 m)  Wt 171 lb 3.2 oz (77.656 kg)  BMI 30.33 kg/m2 General:   Alert and oriented. Pleasant and cooperative. Well-nourished and well-developed.  Ears:  Normal auditory acuity. Cardiovascular:  S1, S2 present without murmurs appreciated. Extremities without clubbing or edema. Respiratory:  Clear to auscultation bilaterally. No wheezes, rales, or rhonchi. No distress.  Gastrointestinal:  +BS, soft, non-tender and non-distended. No HSM noted. No guarding or rebound. No masses appreciated.  Rectal:  Deferred  Neurologic:  Alert and oriented x4;  grossly normal neurologically. Psych:  Alert and cooperative. Normal mood and affect.    08/17/2015 10:37 AM   Disclaimer: This note was dictated with voice recognition software. Similar sounding words can inadvertently be transcribed and may not be corrected upon review.

## 2015-08-17 NOTE — Assessment & Plan Note (Signed)
First-ever screening colonoscopy with a single polyp found to be benign. Recommend repeat in 10 years, I will ensure she is on the recall list for this.

## 2016-02-03 ENCOUNTER — Other Ambulatory Visit (HOSPITAL_COMMUNITY)
Admission: RE | Admit: 2016-02-03 | Discharge: 2016-02-03 | Disposition: A | Discharge status: Home / Self Care | Payer: PRIVATE HEALTH INSURANCE | Source: Ambulatory Visit | Attending: Family Medicine | Admitting: Family Medicine

## 2016-02-03 ENCOUNTER — Other Ambulatory Visit: Payer: Self-pay | Admitting: Family Medicine

## 2016-02-03 DIAGNOSIS — Z1151 Encounter for screening for human papillomavirus (HPV): Secondary | ICD-10-CM | POA: Insufficient documentation

## 2016-02-03 DIAGNOSIS — Z01411 Encounter for gynecological examination (general) (routine) with abnormal findings: Secondary | ICD-10-CM | POA: Diagnosis present

## 2016-02-08 LAB — CYTOLOGY - PAP
Diagnosis: NEGATIVE
HPV (WINDOPATH): NOT DETECTED

## 2016-07-02 ENCOUNTER — Other Ambulatory Visit: Payer: Self-pay | Admitting: Obstetrics and Gynecology

## 2016-07-02 DIAGNOSIS — R102 Pelvic and perineal pain: Secondary | ICD-10-CM

## 2016-07-04 ENCOUNTER — Ambulatory Visit (HOSPITAL_COMMUNITY)
Admission: RE | Admit: 2016-07-04 | Discharge: 2016-07-04 | Disposition: A | Discharge status: Home / Self Care | Payer: PRIVATE HEALTH INSURANCE | Source: Ambulatory Visit | Attending: Obstetrics and Gynecology | Admitting: Obstetrics and Gynecology

## 2016-07-04 DIAGNOSIS — R102 Pelvic and perineal pain: Secondary | ICD-10-CM | POA: Insufficient documentation

## 2016-07-04 DIAGNOSIS — N83201 Unspecified ovarian cyst, right side: Secondary | ICD-10-CM | POA: Diagnosis not present

## 2016-07-09 ENCOUNTER — Ambulatory Visit (INDEPENDENT_AMBULATORY_CARE_PROVIDER_SITE_OTHER): Payer: PRIVATE HEALTH INSURANCE | Admitting: Gastroenterology

## 2016-07-09 ENCOUNTER — Other Ambulatory Visit: Payer: Self-pay | Admitting: Gastroenterology

## 2016-07-09 ENCOUNTER — Other Ambulatory Visit (HOSPITAL_COMMUNITY)
Admission: RE | Admit: 2016-07-09 | Discharge: 2016-07-09 | Disposition: A | Discharge status: Home / Self Care | Payer: PRIVATE HEALTH INSURANCE | Source: Ambulatory Visit | Attending: Gastroenterology | Admitting: Gastroenterology

## 2016-07-09 ENCOUNTER — Ambulatory Visit (HOSPITAL_COMMUNITY)
Admission: RE | Admit: 2016-07-09 | Discharge: 2016-07-09 | Disposition: A | Discharge status: Home / Self Care | Payer: PRIVATE HEALTH INSURANCE | Source: Ambulatory Visit | Attending: Gastroenterology | Admitting: Gastroenterology

## 2016-07-09 ENCOUNTER — Encounter: Payer: Self-pay | Admitting: Gastroenterology

## 2016-07-09 ENCOUNTER — Telehealth: Payer: Self-pay | Admitting: Gastroenterology

## 2016-07-09 DIAGNOSIS — R109 Unspecified abdominal pain: Secondary | ICD-10-CM | POA: Diagnosis not present

## 2016-07-09 LAB — COMPREHENSIVE METABOLIC PANEL
ALBUMIN: 4.1 g/dL (ref 3.5–5.0)
ALT: 18 U/L (ref 14–54)
AST: 20 U/L (ref 15–41)
Alkaline Phosphatase: 40 U/L (ref 38–126)
Anion gap: 9 (ref 5–15)
BILIRUBIN TOTAL: 0.7 mg/dL (ref 0.3–1.2)
BUN: 13 mg/dL (ref 6–20)
CHLORIDE: 99 mmol/L — AB (ref 101–111)
CO2: 30 mmol/L (ref 22–32)
Calcium: 8.9 mg/dL (ref 8.9–10.3)
Creatinine, Ser: 0.65 mg/dL (ref 0.44–1.00)
GFR calc Af Amer: >60 mL/min (ref >60)
GFR calc non Af Amer: >60 mL/min (ref >60)
GLUCOSE: 96 mg/dL (ref 65–99)
POTASSIUM: 3.5 mmol/L (ref 3.5–5.1)
Sodium: 138 mmol/L (ref 135–145)
TOTAL PROTEIN: 7.3 g/dL (ref 6.5–8.1)

## 2016-07-09 LAB — CBC WITH DIFFERENTIAL/PLATELET
BASOS ABS: 0.1 10*3/uL (ref 0.0–0.1)
BASOS PCT: 1 %
EOS ABS: 0.1 10*3/uL (ref 0.0–0.7)
EOS PCT: 2 %
HCT: 41.5 % (ref 36.0–46.0)
Hemoglobin: 14.1 g/dL (ref 12.0–15.0)
Lymphocytes Relative: 39 %
Lymphs Abs: 2.6 10*3/uL (ref 0.7–4.0)
MCH: 30.1 pg (ref 26.0–34.0)
MCHC: 34 g/dL (ref 30.0–36.0)
MCV: 88.5 fL (ref 78.0–100.0)
MONO ABS: 0.5 10*3/uL (ref 0.1–1.0)
MONOS PCT: 7 %
Neutro Abs: 3.4 10*3/uL (ref 1.7–7.7)
Neutrophils Relative %: 51 %
PLATELETS: 319 10*3/uL (ref 150–400)
RBC: 4.69 MIL/uL (ref 3.87–5.11)
RDW: 11.9 % (ref 11.5–15.5)
WBC: 6.7 10*3/uL (ref 4.0–10.5)

## 2016-07-09 NOTE — Assessment & Plan Note (Signed)
51 year old female with several week history of constant right-sided abdominal pain, vague in nature, associated with bloating, fullness, and N/V. N/V resolved at time of visit. No recent labs. Pelvic ultrasound on file with uncomplicated cyst, evaluated by GYN.   Proceed with CT abd/pelvis today. May need HIDA. With RLQ pain as well, differentials broad. Check CBC/CMP today as well. Further recommendations to follow.

## 2016-07-09 NOTE — Telephone Encounter (Signed)
Insurance will not approve CT without an ultrasound or xray first.   I would like to do a peer to peer tomorrow if possible. Patient does not want to pay for unnecessary tests, and I don't blame her. I feel a CT would be the best route.  Can we cancel CT for today and regroup tomorrow?

## 2016-07-09 NOTE — Patient Instructions (Signed)
I have ordered a CT scan to be done today. You can also do the blood work at the hospital lab.  They will call me with results. I will then call you with an update. It may be best to stay there until they call me and give you the "all clear" to go home if it is nothing urgent for this evening.  We may need to do further tests if the CT is inconclusive.

## 2016-07-09 NOTE — Progress Notes (Signed)
Referring Provider: Carlos Levering, PA-C Primary Care Physician:  Carlos Levering, PA-C Primary GI: Dr. Gala Romney   Chief Complaint  Patient presents with  . Abdominal Pain    HPI:   Jessica Roman is a 51 y.o. female presenting today with a history of GERD, colonoscopy on file from last year and due for screening in 10 years. Presenting today due to abdominal pain.    Symptoms started 6 weeks ago. RUQ/RLQ pain, radiating down right leg. Lower abdominal cramping. Found to have cyst on right ovary that is 2cm. Friday night was vomiting so bad she pulled a muscle in her back. Trying to sleep on her left side and back. GERD flares from time to time but nothing like this. Protonix once daily. Ranitidine as needed in evenings. Burping a lot. Pants hurt if tight around middle. Last labs in January at time of physical.   Has a feeling of a "stitch" in her right side. Feels full, stuffed. Will go about 4 times a day, soft stool. Bristol scale # 6. Afebrile but has chills.   Past Medical History:  Diagnosis Date  . GERD (gastroesophageal reflux disease)   . Hypertension   . PONV (postoperative nausea and vomiting)     Past Surgical History:  Procedure Laterality Date  . BREAST SURGERY  1988   Reduction  . COLONOSCOPY N/A 03/10/2015   Procedure: COLONOSCOPY;  Surgeon: Daneil Dolin, MD;  Location: [facility];  Service: Endoscopy;  Laterality: N/A;  1000 - moved to 11:15 - office to notify  . DILATION AND CURETTAGE OF UTERUS  2007  . ESOPHAGOGASTRODUODENOSCOPY (EGD) WITH ESOPHAGEAL DILATION N/A 06/30/2012   [id number] hiatal hernia; otherwise normal examination/Maloney dilation   . WISDOM TOOTH EXTRACTION      Current Outpatient Prescriptions  Medication Sig Dispense Refill  . hydrochlorothiazide (MICROZIDE) 12.5 MG capsule Take 12.5 mg by mouth daily.    Marland Kitchen losartan (COZAAR) 25 MG tablet Take 25 mg by mouth daily.    . pantoprazole (PROTONIX) 40 MG tablet Take 40 mg by mouth  daily.    . Multiple Vitamin (MULTIVITAMIN) capsule Take 1 capsule by mouth daily.    . phentermine 37.5 MG capsule Take 37.5 mg by mouth every morning.     No current facility-administered medications for this visit.     Allergies as of 07/09/2016  . (No Known Allergies)    Family History  Problem Relation Age of Onset  . Colon cancer Neg Hx   . Ulcers Neg Hx   . Heart attack Father   . Inflammatory bowel disease Neg Hx   . Irritable bowel syndrome Sister     Social History   Social History  . Marital status: Divorced    Spouse name: N/A  . Number of children: N/A  . Years of education: N/A   Social History Main Topics  . Smoking status: Former Smoker    Quit date: 02/15/1995  . Smokeless tobacco: Never Used  . Alcohol use 0.0 oz/week     Comment: 2 glasses of wine a week.  . Drug use: No  . Sexual activity: Not Asked   Other Topics Concern  . None   Social History Narrative  . None    Review of Systems: As mentioned in HPI   Physical Exam: BP 124/85   Pulse 70   Temp 98 F (36.7 C) (Oral)   Ht 5\' 4"  (1.626 m)   Wt 183 lb 12.8 oz (83.4 kg)  BMI 31.55 kg/m  General:   Alert and oriented. No distress noted. Pleasant and cooperative.  Head:  Normocephalic and atraumatic. Eyes:  Conjuctiva clear without scleral icterus. Mouth:  Oral mucosa pink and moist. Good dentition. No lesions. Heart:  S1, S2 present without murmurs, rubs, or gallops. Regular rate and rhythm. Abdomen:  +BS, soft, mild to mod TTP epigastric/RUQ,  and non-distended. No rebound or guarding. No HSM or masses noted. Msk:  Symmetrical without gross deformities. Normal posture. Extremities:  Without edema. Neurologic:  Alert and  oriented x4;  grossly normal neurologically. Psych:  Alert and cooperative. Normal mood and affect.

## 2016-07-10 ENCOUNTER — Telehealth: Payer: Self-pay | Admitting: Gastroenterology

## 2016-07-10 NOTE — Telephone Encounter (Signed)
CT was canceled for Monday

## 2016-07-10 NOTE — Telephone Encounter (Signed)
Called and informed pt that CT was approved. She can have it done tomorrow.  Called [employer]. CT abd/pelvis with contrast scheduled for tomorrow at 4:00pm, pt to arrive at 3:45pm. NPO 4 hours prior to test. She needs to pick-up contrast today. Called and informed pt.

## 2016-07-10 NOTE — Telephone Encounter (Signed)
What do I need to do to get the CT approved?

## 2016-07-10 NOTE — Telephone Encounter (Signed)
Noted. Thanks.

## 2016-07-10 NOTE — Progress Notes (Signed)
CC'ED TO PCP 

## 2016-07-10 NOTE — Telephone Encounter (Signed)
GF talked to insurance company this morning and they will be calling back to speak to AB about case.

## 2016-07-10 NOTE — Telephone Encounter (Signed)
CT abd/pelvis with contrast approved.   Approval #: [id number]  If she is able to get it this week, that would be great. Possibly tomorrow? I don't want it to wait until next week.

## 2016-07-11 ENCOUNTER — Ambulatory Visit (HOSPITAL_COMMUNITY)
Admission: RE | Admit: 2016-07-11 | Discharge: 2016-07-11 | Disposition: A | Discharge status: Home / Self Care | Payer: PRIVATE HEALTH INSURANCE | Source: Ambulatory Visit | Attending: Gastroenterology | Admitting: Gastroenterology

## 2016-07-11 ENCOUNTER — Encounter (HOSPITAL_COMMUNITY): Payer: Self-pay

## 2016-07-11 DIAGNOSIS — K5730 Diverticulosis of large intestine without perforation or abscess without bleeding: Secondary | ICD-10-CM | POA: Diagnosis not present

## 2016-07-11 DIAGNOSIS — M5134 Other intervertebral disc degeneration, thoracic region: Secondary | ICD-10-CM | POA: Insufficient documentation

## 2016-07-11 DIAGNOSIS — R109 Unspecified abdominal pain: Secondary | ICD-10-CM | POA: Diagnosis present

## 2016-07-11 DIAGNOSIS — N838 Other noninflammatory disorders of ovary, fallopian tube and broad ligament: Secondary | ICD-10-CM | POA: Insufficient documentation

## 2016-07-11 DIAGNOSIS — K769 Liver disease, unspecified: Secondary | ICD-10-CM | POA: Insufficient documentation

## 2016-07-11 DIAGNOSIS — I700 Atherosclerosis of aorta: Secondary | ICD-10-CM | POA: Diagnosis not present

## 2016-07-11 MED ORDER — IOPAMIDOL (ISOVUE-300) INJECTION 61%
100.0000 mL | Freq: Once | INTRAVENOUS | Status: AC | PRN
  Administered 2016-07-11: 100 mL via INTRAVENOUS

## 2016-07-12 NOTE — Progress Notes (Signed)
Reviewed CT. She has a tiny area in her liver that may represent a small cyst or hemangioma. This can be followed up on with further testing.  She has moderate stool burden, which could be contributing to abdominal pain, cramping, bloating, and sensation of fullness. She notes multiple bowel movements a day but these are likely unproductive. Known right ovarian cyst.   Let's do this: trial of Linzess 72 mcg once each morning. This could cause loose stool at first but should get better. Liver hypodensity is very small, and we could either repeat an ultrasound in 6 months or pursue an MRI of liver to further characterize. If she continues to have pain despite dealing with constipation, we can pursue a HIDA scan. Let's have her follow-up with me in 4-6 weeks, urgent can be used if needed.

## 2016-07-16 ENCOUNTER — Encounter: Payer: Self-pay | Admitting: Gastroenterology

## 2016-07-16 NOTE — Progress Notes (Signed)
APPT MADE AND LETTER SENT  °

## 2016-08-22 ENCOUNTER — Encounter: Payer: Self-pay | Admitting: Gastroenterology

## 2016-08-22 ENCOUNTER — Ambulatory Visit (INDEPENDENT_AMBULATORY_CARE_PROVIDER_SITE_OTHER): Payer: PRIVATE HEALTH INSURANCE | Admitting: Gastroenterology

## 2016-08-22 VITALS — BP 127/79 | HR 77 | Temp 97.8°F | Ht 64.0 in | Wt 189.6 lb

## 2016-08-22 DIAGNOSIS — R109 Unspecified abdominal pain: Secondary | ICD-10-CM

## 2016-08-22 NOTE — Progress Notes (Signed)
Referring Provider: Carlos Levering, PA-C Primary Care Physician:  Carlos Levering, PA-C Primary GI: Dr. Gala Romney   Chief Complaint  Patient presents with  . Constipation  . Abdominal Pain    HPI:   Jessica Roman "Dollie" is a delightful 51 y.o. female presenting today with a history of GERD. Presented mid June 2018 with abdominal pain located in RUQ/RLQ, lower abdominal cramping. Known right ovarian cyst. CBC, CMP normal at visit. Completed CT June 13th with likely liver cyst or hemangioma. Moderate stool burden. Prescribed Linzess 72 mcg to take due to underlying constipation. Consideration for HIDA scan if persistent pain despite better bowel regimen.   [facility] repeated pelvic ultrasound and said cyst is slightly larger. Now eating yogurt with probiotics for about 3 weeks now. No N/V. GERD has been worse, but she endorses weight gain. Notes constipation now. Red wine caused indigestion. Overall, she states she feels improved from an abdominal pain perspective.   Past Medical History:  Diagnosis Date  . GERD (gastroesophageal reflux disease)   . Hypertension   . PONV (postoperative nausea and vomiting)     Past Surgical History:  Procedure Laterality Date  . BREAST SURGERY  1988   Reduction  . COLONOSCOPY N/A 03/10/2015   Dr. Gala Romney: single 3 mm benign lymphoid polyp s/p removal, colonic diverticulosis   . DILATION AND CURETTAGE OF UTERUS  2007  . ESOPHAGOGASTRODUODENOSCOPY (EGD) WITH ESOPHAGEAL DILATION N/A 06/30/2012   SWN:[id number] hiatal hernia; otherwise normal examination/Maloney dilation   . WISDOM TOOTH EXTRACTION      Current Outpatient Prescriptions  Medication Sig Dispense Refill  . hydrochlorothiazide (MICROZIDE) 12.5 MG capsule Take 12.5 mg by mouth daily.    Marland Kitchen losartan (COZAAR) 25 MG tablet Take 25 mg by mouth daily.    . Multiple Vitamin (MULTIVITAMIN) capsule Take 1 capsule by mouth daily.    . pantoprazole (PROTONIX) 40 MG tablet Take 40 mg by mouth daily.      No current facility-administered medications for this visit.     Allergies as of 08/22/2016  . (No Known Allergies)    Family History  Problem Relation Age of Onset  . Heart attack Father   . Irritable bowel syndrome Sister   . Colon cancer Neg Hx   . Ulcers Neg Hx   . Inflammatory bowel disease Neg Hx     Social History   Social History  . Marital status: Divorced    Spouse name: N/A  . Number of children: N/A  . Years of education: N/A   Social History Main Topics  . Smoking status: Former Smoker    Quit date: 02/15/1995  . Smokeless tobacco: Never Used  . Alcohol use 0.0 oz/week     Comment: 2 glasses of wine a week.  . Drug use: No  . Sexual activity: No   Other Topics Concern  . None   Social History Narrative  . None    Review of Systems: As mentioned in HPI.   Physical Exam: BP 127/79   Pulse 77   Temp 97.8 F (36.6 C) (Oral)   Ht 5\' 4"  (1.626 m)   Wt 189 lb 9.6 oz (86 kg)   BMI 32.54 kg/m  General:   Alert and oriented. No distress noted. Pleasant and cooperative.  Head:  Normocephalic and atraumatic. Eyes:  Conjuctiva clear without scleral icterus. Mouth:  Oral mucosa pink and moist. Good dentition. No lesions. Abdomen:  +BS, soft, non-tender and non-distended. No rebound or guarding. No  HSM or masses noted. Msk:  Symmetrical without gross deformities. Normal posture. Extremities:  Without edema. Neurologic:  Alert and  oriented x4;  grossly normal neurologically. Psych:  Alert and cooperative. Normal mood and affect.

## 2016-08-22 NOTE — Patient Instructions (Signed)
Let's try the Amitiza 8 microgram gelcaps first. Take one with dinner each evening and increase to twice a day (breakfast and dinner) if needed. Monitor for nausea, as this can happen if not taken with food.   If needed, you may try the Amitiza 24 microgram gelcaps twice a day with food.   We will update an ultrasound just of your liver in November/December of this year.   I will see you in 3 months.  Please call with any issues!

## 2016-08-23 NOTE — Progress Notes (Signed)
CC'D TO PCP °

## 2016-08-23 NOTE — Assessment & Plan Note (Signed)
51 year old female with improvement in abdominal pain, known ovarian cyst but unlikely this is entirely the culprit, doubt biliary etiology, and constipation/GERD likely playing the largest role. Trial of Amitiza 8 mcg first, and if no improvement, trial Amitiza 24 mcg. GERD diet and weight loss discussed. Return in 3 months and will update ultrasound of liver at that time as well. Discussed possibility of MRI liver if any changes in liver lesion which is felt to be more characteristic of a cyst or hemangioma.

## 2016-08-28 ENCOUNTER — Ambulatory Visit: Payer: PRIVATE HEALTH INSURANCE | Admitting: Nurse Practitioner

## 2016-11-01 ENCOUNTER — Telehealth: Payer: Self-pay | Admitting: Internal Medicine

## 2016-11-01 NOTE — Telephone Encounter (Signed)
RECALL FOR LIVER ULTRASOUND

## 2016-11-01 NOTE — Telephone Encounter (Signed)
Letter mailed

## 2016-11-22 ENCOUNTER — Ambulatory Visit (INDEPENDENT_AMBULATORY_CARE_PROVIDER_SITE_OTHER): Payer: PRIVATE HEALTH INSURANCE | Admitting: Gastroenterology

## 2016-11-22 ENCOUNTER — Encounter: Payer: Self-pay | Admitting: Gastroenterology

## 2016-11-22 ENCOUNTER — Telehealth: Payer: Self-pay

## 2016-11-22 VITALS — BP 126/86 | HR 74 | Temp 97.8°F | Ht 64.0 in | Wt 189.8 lb

## 2016-11-22 DIAGNOSIS — K219 Gastro-esophageal reflux disease without esophagitis: Secondary | ICD-10-CM | POA: Diagnosis not present

## 2016-11-22 DIAGNOSIS — R109 Unspecified abdominal pain: Secondary | ICD-10-CM | POA: Diagnosis not present

## 2016-11-22 DIAGNOSIS — K7689 Other specified diseases of liver: Secondary | ICD-10-CM

## 2016-11-22 NOTE — Progress Notes (Signed)
Referring Provider: Carlos Levering, PA-C Primary Care Physician:  Jonathon Jordan, MD Primary GI: Dr. Gala Romney   Chief Complaint  Patient presents with  . Abdominal Pain    better  . Constipation    minimal now    HPI:   Jessica Roman is a delightful 51 y.o. female presenting today with a history of GERD. She presented mid June 2018 with abdominal pain located in RUQ/RLQ, lower abdominal cramping. Known right ovarian cyst. CBC, CMP normal at visit. Completed CT June 13th with likely liver cyst or hemangioma. This has been evaluated further with pelvic ultrasound by [facility]. Moderate stool burden.  Consideration for HIDA scan if persistent pain despite better bowel regimen.   At last visit, she was given Amitiza  to trial due to constipation. Also will need US liver due to known liver lesion, with possibility of MRI liver if any changes in liver lesion.   Colonix a few times a week. Constipation better. Abdominal pain improved. Doing yogurt every day. Getting another ultrasound in December. Protonix once a day. Overall feels much improved. In process of renovating a house.   Past Medical History:  Diagnosis Date  . GERD (gastroesophageal reflux disease)   . Hypertension   . PONV (postoperative nausea and vomiting)     Past Surgical History:  Procedure Laterality Date  . BREAST SURGERY  1988   Reduction  . COLONOSCOPY N/A 03/10/2015   Dr. Gala Romney: single 3 mm benign lymphoid polyp s/p removal, colonic diverticulosis   . DILATION AND CURETTAGE OF UTERUS  2007  . ESOPHAGOGASTRODUODENOSCOPY (EGD) WITH ESOPHAGEAL DILATION N/A 06/30/2012   [id number] hiatal hernia; otherwise normal examination/Maloney dilation   . WISDOM TOOTH EXTRACTION      Current Outpatient Prescriptions  Medication Sig Dispense Refill  . losartan-hydrochlorothiazide (HYZAAR) 50-12.5 MG tablet Take 1 tablet by mouth daily.  7  . Multiple Vitamin (MULTIVITAMIN) capsule Take 1 capsule by mouth daily.    .  pantoprazole (PROTONIX) 40 MG tablet Take 40 mg by mouth daily.     No current facility-administered medications for this visit.     Allergies as of 11/22/2016  . (No Known Allergies)    Family History  Problem Relation Age of Onset  . Heart attack Father   . Irritable bowel syndrome Sister   . Colon cancer Neg Hx   . Ulcers Neg Hx   . Inflammatory bowel disease Neg Hx     Social History   Social History  . Marital status: Divorced    Spouse name: N/A  . Number of children: N/A  . Years of education: N/A   Social History Main Topics  . Smoking status: Former Smoker    Quit date: 02/15/1995  . Smokeless tobacco: Never Used  . Alcohol use 0.0 oz/week     Comment: 2 glasses of wine/day (3x/wk)  . Drug use: No  . Sexual activity: No   Other Topics Concern  . None   Social History Narrative  . None    Review of Systems: Gen: Denies fever, chills, anorexia. Denies fatigue, weakness, weight loss.  CV: Denies chest pain, palpitations, syncope, peripheral edema, and claudication. Resp: Denies dyspnea at rest, cough, wheezing, coughing up blood, and pleurisy. GI: see HPI  Derm: Denies rash, itching, dry skin Psych: Denies depression, anxiety, memory loss, confusion. No homicidal or suicidal ideation.  Heme: Denies bruising, bleeding, and enlarged lymph nodes.  Physical Exam: BP 126/86   Pulse 74   Temp 97.8  F (36.6 C) (Oral)   Ht 5\' 4"  (1.626 m)   Wt 189 lb 12.8 oz (86.1 kg)   LMP 05/30/2012   BMI 32.58 kg/m  General:   Alert and oriented. No distress noted. Pleasant and cooperative.  Head:  Normocephalic and atraumatic. Eyes:  Conjuctiva clear without scleral icterus. Mouth:  Oral mucosa pink and moist.  Abdomen:  +BS, soft, non-tender and non-distended. No rebound or guarding. No HSM or masses noted. Msk:  Symmetrical without gross deformities. Normal posture. Extremities:  Without edema. Neurologic:  Alert and  oriented x4 Psych:  Alert and cooperative.  Normal mood and affect.

## 2016-11-22 NOTE — Patient Instructions (Signed)
I have ordered an ultrasound of your liver.  I am so glad you are doing well!  Enjoy the fixer upper!  It was a pleasure to see you today. I strive to create trusting relationships with patients to provide genuine, compassionate, and quality care. I value your feedback. If you receive a survey regarding your visit,  I greatly appreciate you the taking time to fill this out.   Annitta Needs, PhD, ANP-BC [facility]

## 2016-11-22 NOTE — Telephone Encounter (Signed)
Called and informed pt of [place] ruq appt 11/27/16 at 10:30am, pt to arrive at 10:15am. NPO after midnight.

## 2016-11-27 ENCOUNTER — Ambulatory Visit (HOSPITAL_COMMUNITY)
Admission: RE | Admit: 2016-11-27 | Discharge: 2016-11-27 | Disposition: A | Discharge status: Home / Self Care | Payer: PRIVATE HEALTH INSURANCE | Source: Ambulatory Visit | Attending: Gastroenterology | Admitting: Gastroenterology

## 2016-11-27 DIAGNOSIS — K7689 Other specified diseases of liver: Secondary | ICD-10-CM | POA: Diagnosis not present

## 2016-11-27 DIAGNOSIS — Q446 Cystic disease of liver: Secondary | ICD-10-CM | POA: Diagnosis not present

## 2016-11-28 NOTE — Assessment & Plan Note (Signed)
Extensive evaluation. Much improved now with constipation managed using OTC agents. Continues PPI. Return in 1 year.

## 2016-11-28 NOTE — Assessment & Plan Note (Signed)
Incidentally noted on CT. Pursue ultrasound. May need additional imaging.

## 2016-11-28 NOTE — Assessment & Plan Note (Addendum)
Continue with Protonix once daily. Follow-up in 1 year.

## 2016-11-29 NOTE — Progress Notes (Signed)
CC'D TO PCP °

## 2016-12-10 NOTE — Progress Notes (Signed)
Likely dealing with mildly complex cyst in left lobe of liver. Right lobe of liver not well appreciated on exam. Would follow serially.

## 2017-03-04 NOTE — Progress Notes (Signed)
We had discussed serial following of liver cyst. I've had this on my list to follow-up on for "recall". This is likely benign, but I feel we need to wrap up the evaluation with an MRI of liver (instead of ultrasound) for this mode of surveillance this time. If all is well, would not need to continue monitoring. These cysts were incidentally found on prior imaging, and she is asymptomatic.

## 2017-03-05 ENCOUNTER — Other Ambulatory Visit: Payer: Self-pay

## 2017-03-05 DIAGNOSIS — K769 Liver disease, unspecified: Secondary | ICD-10-CM

## 2017-03-13 ENCOUNTER — Ambulatory Visit (HOSPITAL_COMMUNITY): Payer: PRIVATE HEALTH INSURANCE

## 2017-09-18 ENCOUNTER — Encounter: Payer: Self-pay | Admitting: Internal Medicine

## 2019-05-27 ENCOUNTER — Other Ambulatory Visit: Payer: Self-pay | Admitting: Obstetrics and Gynecology

## 2019-05-27 DIAGNOSIS — Z1231 Encounter for screening mammogram for malignant neoplasm of breast: Secondary | ICD-10-CM

## 2019-06-23 ENCOUNTER — Ambulatory Visit
Admission: RE | Admit: 2019-06-23 | Discharge: 2019-06-23 | Disposition: A | Discharge status: Home / Self Care | Payer: PRIVATE HEALTH INSURANCE | Source: Ambulatory Visit | Attending: Obstetrics and Gynecology | Admitting: Obstetrics and Gynecology

## 2019-06-23 ENCOUNTER — Other Ambulatory Visit: Payer: Self-pay

## 2019-06-23 DIAGNOSIS — Z1231 Encounter for screening mammogram for malignant neoplasm of breast: Secondary | ICD-10-CM

## 2020-07-26 ENCOUNTER — Other Ambulatory Visit (HOSPITAL_COMMUNITY): Payer: Self-pay | Admitting: Orthopedic Surgery

## 2020-07-26 DIAGNOSIS — M25562 Pain in left knee: Secondary | ICD-10-CM

## 2020-08-09 ENCOUNTER — Encounter (HOSPITAL_COMMUNITY): Payer: Self-pay

## 2020-08-09 ENCOUNTER — Ambulatory Visit (HOSPITAL_COMMUNITY): Payer: PRIVATE HEALTH INSURANCE

## 2020-08-23 ENCOUNTER — Ambulatory Visit (HOSPITAL_COMMUNITY)
Admission: RE | Admit: 2020-08-23 | Discharge: 2020-08-23 | Disposition: A | Discharge status: Home / Self Care | Payer: PRIVATE HEALTH INSURANCE | Source: Ambulatory Visit | Attending: Orthopedic Surgery | Admitting: Orthopedic Surgery

## 2020-08-23 ENCOUNTER — Other Ambulatory Visit: Payer: Self-pay

## 2020-08-23 DIAGNOSIS — M25562 Pain in left knee: Secondary | ICD-10-CM

## 2022-01-29 ENCOUNTER — Other Ambulatory Visit: Payer: Self-pay

## 2022-01-29 ENCOUNTER — Emergency Department (HOSPITAL_COMMUNITY): Payer: PRIVATE HEALTH INSURANCE

## 2022-01-29 ENCOUNTER — Encounter (HOSPITAL_COMMUNITY): Payer: Self-pay

## 2022-01-29 ENCOUNTER — Emergency Department (HOSPITAL_COMMUNITY)
Admission: EM | Admit: 2022-01-29 | Discharge: 2022-01-29 | Disposition: A | Discharge status: Home / Self Care | Payer: PRIVATE HEALTH INSURANCE | Attending: Emergency Medicine | Admitting: Emergency Medicine

## 2022-01-29 DIAGNOSIS — X501XXA Overexertion from prolonged static or awkward postures, initial encounter: Secondary | ICD-10-CM | POA: Diagnosis not present

## 2022-01-29 DIAGNOSIS — I10 Essential (primary) hypertension: Secondary | ICD-10-CM | POA: Diagnosis not present

## 2022-01-29 DIAGNOSIS — R0781 Pleurodynia: Secondary | ICD-10-CM | POA: Insufficient documentation

## 2022-01-29 LAB — CBC
HCT: 42.2 % (ref 36.0–46.0)
Hemoglobin: 14.1 g/dL (ref 12.0–15.0)
MCH: 30.3 pg (ref 26.0–34.0)
MCHC: 33.4 g/dL (ref 30.0–36.0)
MCV: 90.8 fL (ref 80.0–100.0)
Platelets: 304 10*3/uL (ref 150–400)
RBC: 4.65 MIL/uL (ref 3.87–5.11)
RDW: 12.4 % (ref 11.5–15.5)
WBC: 6.3 10*3/uL (ref 4.0–10.5)
nRBC: 0 % (ref 0.0–0.2)

## 2022-01-29 LAB — BASIC METABOLIC PANEL
Anion gap: 10 (ref 5–15)
BUN: 21 mg/dL — ABNORMAL HIGH (ref 6–20)
CO2: 24 mmol/L (ref 22–32)
Calcium: 8.5 mg/dL — ABNORMAL LOW (ref 8.9–10.3)
Chloride: 104 mmol/L (ref 98–111)
Creatinine, Ser: 0.63 mg/dL (ref 0.44–1.00)
GFR, Estimated: >60 mL/min (ref >60)
Glucose, Bld: 129 mg/dL — ABNORMAL HIGH (ref 70–99)
Potassium: 3.2 mmol/L — ABNORMAL LOW (ref 3.5–5.1)
Sodium: 138 mmol/L (ref 135–145)

## 2022-01-29 MED ORDER — IBUPROFEN 400 MG PO TABS
600.0000 mg | ORAL_TABLET | Freq: Once | ORAL | Status: AC
  Administered 2022-01-29: 600 mg via ORAL
  Filled 2022-01-29: qty 2

## 2022-01-29 NOTE — Discharge Instructions (Signed)
Evaluation for rib pain was overall reassuring.  X-ray was normal.  Advised that you follow-up with your PCP if your symptoms persist.  Also recommend applying ice to the area of pain.  You also take ibuprofen and Tylenol for pain.

## 2022-01-29 NOTE — ED Triage Notes (Signed)
Patient reports that she has been having left rib pain with [facility] for the past four weeks. Concerned that she has broken rib due to coughing excessively from being sick. States that she sneezed yesterday and pain has worsened.

## 2022-01-29 NOTE — ED Provider Notes (Signed)
[facility] EMERGENCY DEPARTMENT Provider Note   CSN: [id number] Arrival date & time: 01/29/22  0845     History  Chief Complaint  Patient presents with   Rib Injury   HPI Jessica Roman is a 57 y.o. female with GERD and hypertension presenting for rib injury.  Patient states that over Thanksgiving she had the flu with a terrible cough.  A couple days after Thanksgiving she twisted in bed and coughed really hard and felt a sharp pain in the left side of her chest at the lowest rib.  It was all of a sudden felt sharp.  She took some ibuprofen which helped.  She sneezed this past Saturday and pain in her left rib started again.  Pain is worse with breathing making her feel somewhat short of breath at times.  Denies any other trauma.  HPI     Home Medications Prior to Admission medications   Medication Sig Start Date End Date Taking? Authorizing Provider  losartan-hydrochlorothiazide (HYZAAR) 50-12.5 MG tablet Take 1 tablet by mouth daily. 11/13/16   [provider]  Multiple Vitamin (MULTIVITAMIN) capsule Take 1 capsule by mouth daily.    [provider]  pantoprazole (PROTONIX) 40 MG tablet Take 40 mg by mouth daily.    [provider]      Allergies    Patient has no known allergies.    Review of Systems   Review of Systems  Respiratory:  Positive for shortness of breath.   Musculoskeletal:        Rib pain    Physical Exam   Vitals:   01/29/22 1126 01/29/22 1130  BP: (!) 151/93 (!) 139/90  Pulse: 72 81  Resp: (!) 22 20  Temp:    SpO2: 99% 100%    CONSTITUTIONAL:  well-appearing, NAD NEURO:  Alert and oriented x 3, CN 3-12 grossly intact EYES:  eyes equal and reactive ENT/NECK:  Supple, no stridor  CARDIO:  regular rate and rhythm, appears well-perfused  PULM:  No respiratory distress, CTAB Chest wall: No deformity, crepitus, or step-off appreciated MSK/SPINE:  No gross deformities, no edema, moves all extremities  SKIN:  no rash,  atraumatic   *Additional and/or pertinent findings included in MDM below    ED Results / Procedures / Treatments   Labs (all labs ordered are listed, but only abnormal results are displayed) Labs Reviewed  BASIC METABOLIC PANEL - Abnormal; Notable for the following components:      Result Value   Potassium 3.2 (*)    Glucose, Bld 129 (*)    BUN 21 (*)    Calcium 8.5 (*)    All other components within normal limits  CBC    EKG Revealed normal sinus rhythm  Radiology DG Chest 2 View  Result Date: 01/29/2022 CLINICAL DATA:  57 year old female with history of rib pain and shortness of breath. EXAM: CHEST - 2 VIEW COMPARISON:  No priors. FINDINGS: Lung volumes are normal. No consolidative airspace disease. No pleural effusions. No pneumothorax. No pulmonary nodule or mass noted. Pulmonary vasculature and the cardiomediastinal silhouette are within normal limits. IMPRESSION: No radiographic evidence of acute cardiopulmonary disease. Electronically Signed   By: Vinnie Langton M.D.   On: 01/29/2022 09:52    Procedures Procedures    Medications Ordered in ED Medications  ibuprofen (ADVIL) tablet 600 mg (600 mg Oral Given 01/29/22 9798)    ED Course/ Medical Decision Making/ A&P  Medical Decision Making Amount and/or Complexity of Data Reviewed Labs: ordered. Radiology: ordered.  Risk Prescription drug management.   56 year old well-appearing female who is hemodynamically stable presenting for rib pain and associated shortness of breath.  Physical exam was unremarkable.  Differential diagnosis for this complaint includes rib fracture, pneumothorax, pneumonia, ACS, soft tissue injury.  Considered rib fracture or pneumothorax but unlikely given findings on x-ray.  Also vitals were normal and patient was breathing comfortably in in bed during assessment.  Considered ACS but unlikely given the character of her pain and normal EKG.  Also considered pneumonia  but unlikely given no focal opacity on x-ray.  Given the mechanism of her injury it is likely a soft tissue injury around her ribs.  Advised that she continue conservative treatment at home follow-up with her PCP if her symptoms persist.  Treat her pain with ibuprofen.        Final Clinical Impression(s) / ED Diagnoses Final diagnoses:  Rib pain    Rx / DC Orders ED Discharge Orders     None         Harriet Pho, PA-C 01/29/22 1150    Cristie Hem, MD 01/29/22 [phone]

## 2023-06-25 IMAGING — MR MR KNEE*L* W/O CM
7 series · 40 of 40 positions shown · non-contrast
Comparison: None.

CLINICAL DATA: Left knee pain for 3 weeks.  No known injury.

EXAM:
MRI OF THE LEFT KNEE WITHOUT CONTRAST
TECHNIQUE: Multiplanar, multisequence MR imaging of the knee was performed. No
intravenous contrast was administered.

[Series 8: T2 fat-sat · axial · left · 4.0mm · 0.47mm/px · z∈[-103,+19]mm · 7 of 26 slices shown (1 of 3)]
[im 1/26]
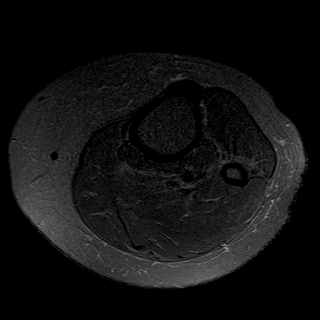
[im 5/26]
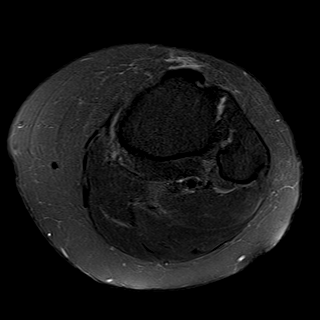
[im 9/26]
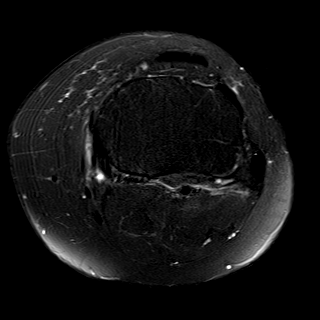
[im 13/26]
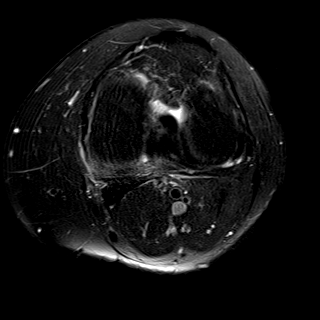
[im 17/26]
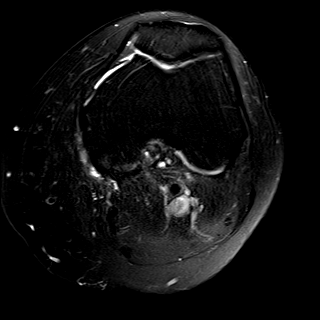
[im 21/26]
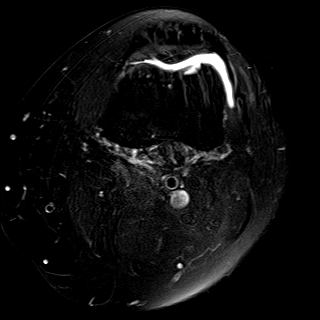
[im 26/26]
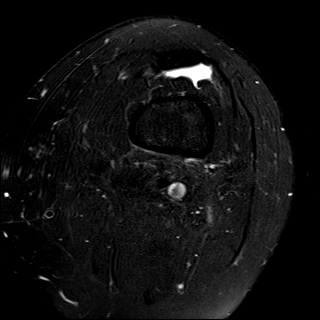

[Series 9: T1 · coronal · left · 4.0mm · 0.59mm/px · 5 of 24 slices shown]
[im 1/24]
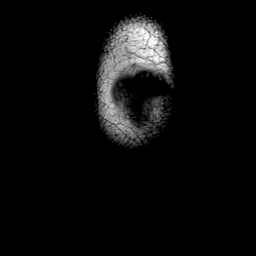
[im 6/24]
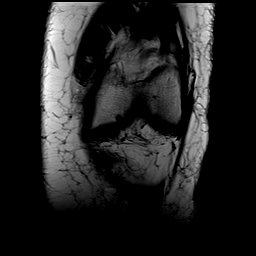
[im 12/24]
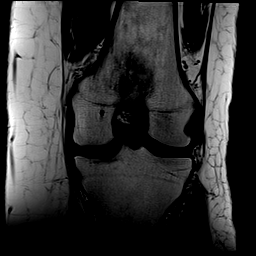
[im 18/24]
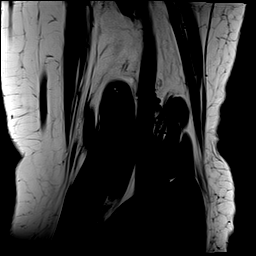
[im 24/24]
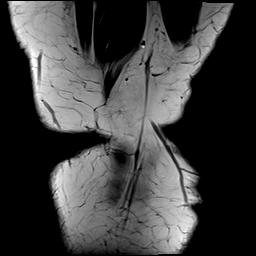

[Series 10: T2 fat-sat · coronal · left · 4.0mm · 0.59mm/px · 6 of 27 slices shown (2 of 3)]
[im 1/27]
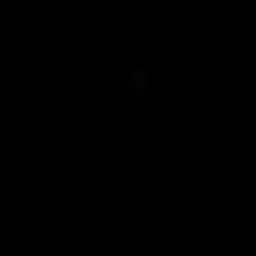
[im 6/27]
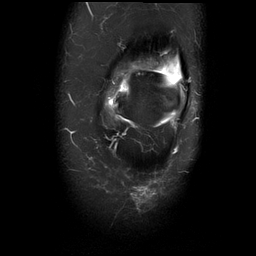
[im 11/27]
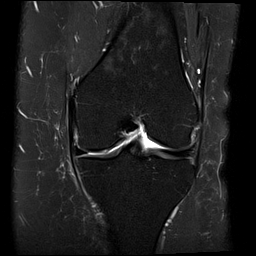
[im 16/27]
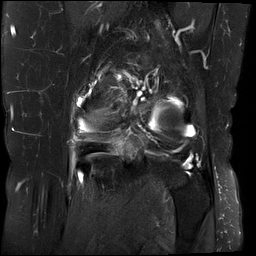
[im 21/27]
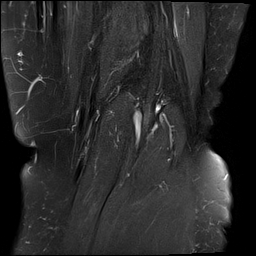
[im 27/27]
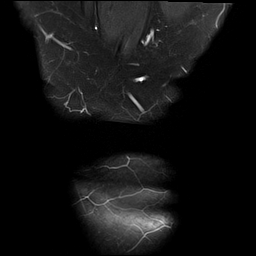

[Series 11: PD fat-sat · coronal · left · 4.0mm · 0.59mm/px · 6 of 28 slices shown (1 of 2)]
[im 1/28]
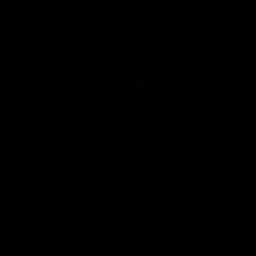
[im 6/28]
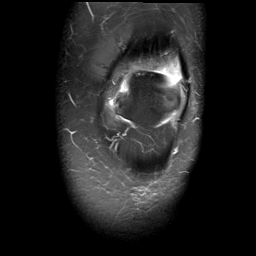
[im 11/28]
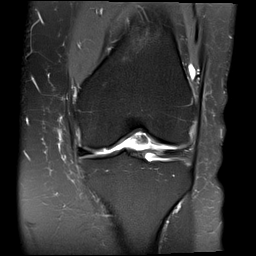
[im 17/28]
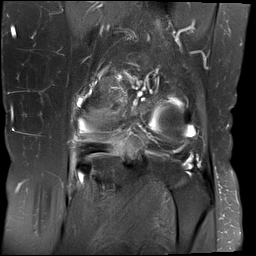
[im 22/28]
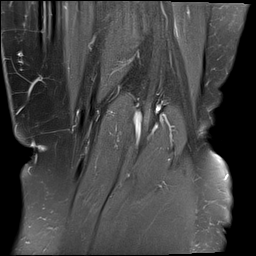
[im 28/28]
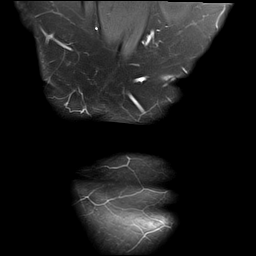

[Series 12: PD fat-sat · sagittal · left · 3.0mm · 0.52mm/px · 8 of 35 slices shown (2 of 2)]
[im 1/35]
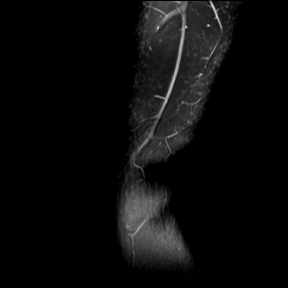
[im 5/35]
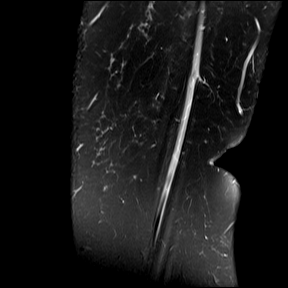
[im 10/35]
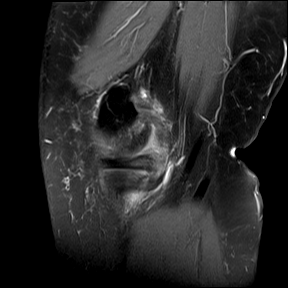
[im 15/35]
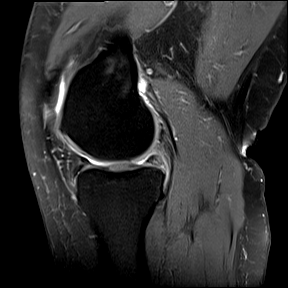
[im 20/35]
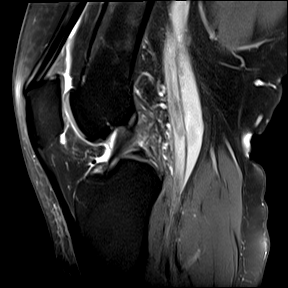
[im 25/35]
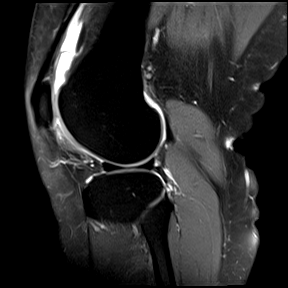
[im 30/35]
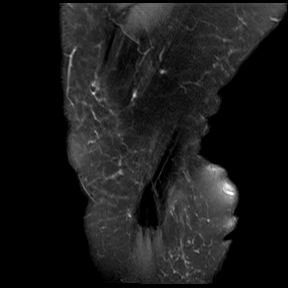
[im 35/35]
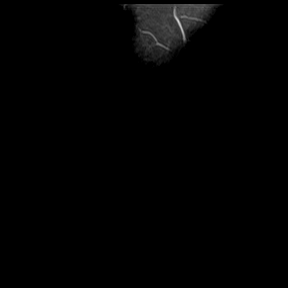

[Series 13: T2 fat-sat · sagittal · left · 3.0mm · 0.59mm/px · 6 of 28 slices shown (3 of 3)]
[im 1/28]
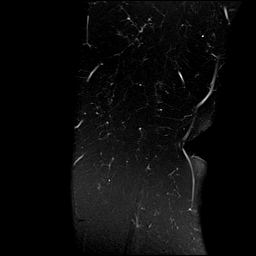
[im 6/28]
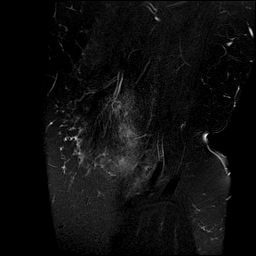
[im 11/28]
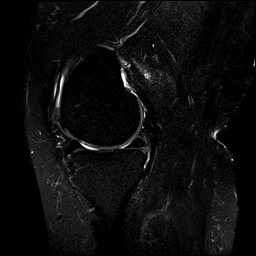
[im 17/28]
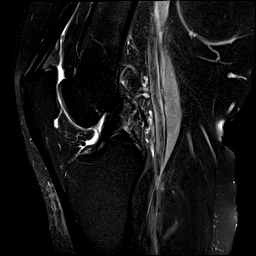
[im 22/28]
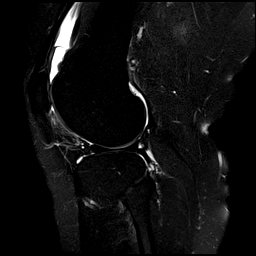
[im 28/28]
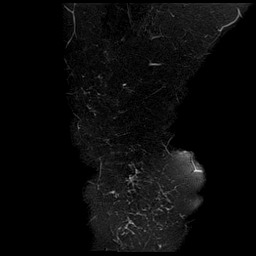

[Series 14: PD · coronal · left · 2.0mm · 0.47mm/px · 2 of 10 slices shown]
[im 1/10]
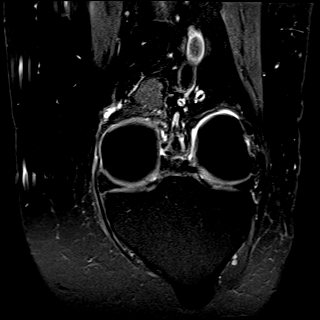
[im 10/10]
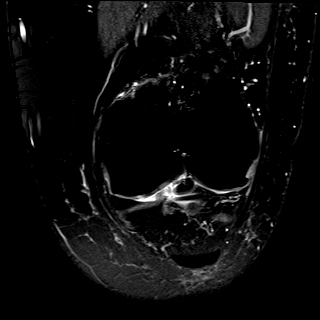

[40 of 40 positions shown; findings below may reference images not displayed]

FINDINGS: MENISCI

Medial meniscus: High-grade radial tear of the medial meniscal
posterior horn near the posterior root attachment site (series 12,
image 17). Mild extrusion of the medial meniscal body. Background of
mild intrasubstance degeneration.

Lateral meniscus:  Intact.

LIGAMENTS

Cruciates:  Intact ACL and PCL.

Collaterals: Intact MCL with mild periligamentous edema, which may
be reactive or reflect a grade 1 sprain. Lateral collateral ligament
complex intact.

CARTILAGE

Patellofemoral: Mild partial-thickness chondral loss within the
patellofemoral compartment.

Medial: Full-thickness chondral fissuring within the medial femoral
condyle anteriorly (series 10, images 18-19). Medial tibial plateau
articular cartilage intact.

Lateral:  No chondral defect.

Joint: Small knee joint effusion. Hoffa's fat within normal limits.

Popliteal Fossa:  No Baker cyst. Intact popliteus tendon.

Extensor Mechanism:  Intact quadriceps tendon and patellar tendon.

Bones: No focal marrow signal abnormality. No fracture or
dislocation.

Other: Mild pes anserine bursal edema.
IMPRESSION: 1. High-grade radial tear of the medial meniscal posterior horn near
the posterior root attachment site.
2. Intact MCL with mild periligamentous edema, which may be reactive
or reflect a grade 1 sprain.
3. Full-thickness chondral fissuring within the medial femoral
condyle anteriorly.
4. Small knee joint effusion.
5. Mild pes anserine bursal edema.
